# Patient Record
Sex: Female | Born: 1990
Health system: Southern US, Community
[De-identification: ages and names within clinical notes are randomized; demographics above are authoritative.]

## PROBLEM LIST (undated history)

## (undated) DIAGNOSIS — E063 Autoimmune thyroiditis: Secondary | ICD-10-CM

## (undated) DIAGNOSIS — N200 Calculus of kidney: Secondary | ICD-10-CM

## (undated) DIAGNOSIS — T7840XA Allergy, unspecified, initial encounter: Secondary | ICD-10-CM

## (undated) DIAGNOSIS — N289 Disorder of kidney and ureter, unspecified: Secondary | ICD-10-CM

## (undated) HISTORY — PX: DG BARIUM SWALLOW (ARMC HX): HXRAD1448

## (undated) HISTORY — DX: Calculus of kidney: N20.0

## (undated) HISTORY — PX: KIDNEY STONE SURGERY: SHX686

## (undated) HISTORY — DX: Autoimmune thyroiditis: E06.3

## (undated) HISTORY — DX: Allergy, unspecified, initial encounter: T78.40XA

## (undated) HISTORY — PX: WISDOM TOOTH EXTRACTION: SHX21

---
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
## 2010-03-28 ENCOUNTER — Ambulatory Visit: Payer: Self-pay | Admitting: Pediatrics

## 2010-06-23 ENCOUNTER — Ambulatory Visit
Admission: RE | Admit: 2010-06-23 | Discharge: 2010-06-23 | Payer: Self-pay | Source: Home / Self Care | Attending: Pediatrics | Admitting: Pediatrics

## 2010-10-01 ENCOUNTER — Emergency Department (HOSPITAL_COMMUNITY): Payer: BLUE CROSS/BLUE SHIELD

## 2010-10-01 ENCOUNTER — Emergency Department (HOSPITAL_COMMUNITY)
Admission: EM | Admit: 2010-10-01 | Discharge: 2010-10-01 | Disposition: A | Discharge status: Home / Self Care | Payer: BLUE CROSS/BLUE SHIELD | Attending: Emergency Medicine | Admitting: Emergency Medicine

## 2010-10-01 DIAGNOSIS — R339 Retention of urine, unspecified: Secondary | ICD-10-CM | POA: Insufficient documentation

## 2010-10-01 DIAGNOSIS — R109 Unspecified abdominal pain: Secondary | ICD-10-CM | POA: Insufficient documentation

## 2010-10-01 DIAGNOSIS — M549 Dorsalgia, unspecified: Secondary | ICD-10-CM | POA: Insufficient documentation

## 2010-10-01 DIAGNOSIS — R110 Nausea: Secondary | ICD-10-CM | POA: Insufficient documentation

## 2010-10-01 DIAGNOSIS — R42 Dizziness and giddiness: Secondary | ICD-10-CM | POA: Insufficient documentation

## 2010-10-01 LAB — DIFFERENTIAL
Basophils Absolute: 0.1 10*3/uL (ref 0.0–0.1)
Eosinophils Absolute: 0.2 10*3/uL (ref 0.0–0.7)
Eosinophils Relative: 2 % (ref 0–5)
Lymphocytes Relative: 18 % (ref 12–46)
Lymphs Abs: 1.6 10*3/uL (ref 0.7–4.0)
Monocytes Absolute: 0.4 10*3/uL (ref 0.1–1.0)
Monocytes Relative: 4 % (ref 3–12)
Neutro Abs: 6.6 10*3/uL (ref 1.7–7.7)
Neutrophils Relative %: 75 % (ref 43–77)

## 2010-10-01 LAB — CBC
HCT: 42.4 % (ref 36.0–46.0)
Hemoglobin: 14.7 g/dL (ref 12.0–15.0)
MCH: 31.8 pg (ref 26.0–34.0)
MCV: 91.8 fL (ref 78.0–100.0)
Platelets: 321 10*3/uL (ref 150–400)
RBC: 4.62 MIL/uL (ref 3.87–5.11)
WBC: 8.9 10*3/uL (ref 4.0–10.5)

## 2010-10-01 LAB — URINALYSIS, ROUTINE W REFLEX MICROSCOPIC
Glucose, UA: NEGATIVE mg/dL
Ketones, ur: NEGATIVE mg/dL
Nitrite: NEGATIVE
Protein, ur: NEGATIVE mg/dL
Specific Gravity, Urine: 1.02 (ref 1.005–1.030)
Urobilinogen, UA: 0.2 mg/dL (ref 0.0–1.0)
pH: 7 (ref 5.0–8.0)

## 2010-10-01 LAB — BASIC METABOLIC PANEL
BUN: 13 mg/dL (ref 6–23)
CO2: 27 mEq/L (ref 19–32)
Calcium: 9.4 mg/dL (ref 8.4–10.5)
Chloride: 104 mEq/L (ref 96–112)
Creatinine, Ser: 0.77 mg/dL (ref 0.4–1.2)
GFR calc Af Amer: >60 mL/min (ref >60)
Glucose, Bld: 106 mg/dL — ABNORMAL HIGH (ref 70–99)
Potassium: 3.8 mEq/L (ref 3.5–5.1)

## 2010-10-01 LAB — URINE MICROSCOPIC-ADD ON

## 2010-10-01 LAB — POCT PREGNANCY, URINE: Preg Test, Ur: NEGATIVE

## 2010-10-03 LAB — URINE CULTURE
Colony Count: NO GROWTH
Culture  Setup Time: 201204152003
Culture: NO GROWTH

## 2010-10-11 ENCOUNTER — Institutional Professional Consult (permissible substitution) (INDEPENDENT_AMBULATORY_CARE_PROVIDER_SITE_OTHER): Payer: BLUE CROSS/BLUE SHIELD | Admitting: Pediatrics

## 2010-10-11 DIAGNOSIS — R279 Unspecified lack of coordination: Secondary | ICD-10-CM

## 2010-10-11 DIAGNOSIS — R6250 Unspecified lack of expected normal physiological development in childhood: Secondary | ICD-10-CM

## 2010-10-11 DIAGNOSIS — F909 Attention-deficit hyperactivity disorder, unspecified type: Secondary | ICD-10-CM

## 2011-01-17 ENCOUNTER — Institutional Professional Consult (permissible substitution) (INDEPENDENT_AMBULATORY_CARE_PROVIDER_SITE_OTHER): Payer: BLUE CROSS/BLUE SHIELD | Admitting: Pediatrics

## 2011-01-17 DIAGNOSIS — R279 Unspecified lack of coordination: Secondary | ICD-10-CM

## 2011-01-17 DIAGNOSIS — R6250 Unspecified lack of expected normal physiological development in childhood: Secondary | ICD-10-CM

## 2011-01-17 DIAGNOSIS — F909 Attention-deficit hyperactivity disorder, unspecified type: Secondary | ICD-10-CM

## 2011-02-23 ENCOUNTER — Other Ambulatory Visit (HOSPITAL_COMMUNITY): Payer: Self-pay | Admitting: "Endocrinology

## 2011-02-23 DIAGNOSIS — E240 Pituitary-dependent Cushing's disease: Secondary | ICD-10-CM

## 2011-02-26 ENCOUNTER — Ambulatory Visit (HOSPITAL_COMMUNITY)
Admission: RE | Admit: 2011-02-26 | Discharge: 2011-02-26 | Disposition: A | Discharge status: Home / Self Care | Payer: BLUE CROSS/BLUE SHIELD | Source: Ambulatory Visit | Attending: "Endocrinology | Admitting: "Endocrinology

## 2011-02-26 DIAGNOSIS — E249 Cushing's syndrome, unspecified: Secondary | ICD-10-CM | POA: Insufficient documentation

## 2011-02-26 DIAGNOSIS — H538 Other visual disturbances: Secondary | ICD-10-CM | POA: Insufficient documentation

## 2011-02-26 DIAGNOSIS — E240 Pituitary-dependent Cushing's disease: Secondary | ICD-10-CM

## 2011-02-26 DIAGNOSIS — R635 Abnormal weight gain: Secondary | ICD-10-CM | POA: Insufficient documentation

## 2011-02-26 DIAGNOSIS — R51 Headache: Secondary | ICD-10-CM | POA: Insufficient documentation

## 2011-02-26 MED ORDER — GADOBENATE DIMEGLUMINE 529 MG/ML IV SOLN
6.0000 mL | Freq: Once | INTRAVENOUS | Status: AC | PRN
  Administered 2011-02-26: 6 mL via INTRAVENOUS

## 2011-04-19 ENCOUNTER — Institutional Professional Consult (permissible substitution): Payer: BLUE CROSS/BLUE SHIELD | Admitting: Pediatrics

## 2011-05-03 ENCOUNTER — Institutional Professional Consult (permissible substitution) (INDEPENDENT_AMBULATORY_CARE_PROVIDER_SITE_OTHER): Payer: BLUE CROSS/BLUE SHIELD | Admitting: Pediatrics

## 2011-05-03 DIAGNOSIS — F909 Attention-deficit hyperactivity disorder, unspecified type: Secondary | ICD-10-CM

## 2011-05-03 DIAGNOSIS — R279 Unspecified lack of coordination: Secondary | ICD-10-CM

## 2011-05-24 ENCOUNTER — Emergency Department (HOSPITAL_COMMUNITY): Payer: BC Managed Care – PPO

## 2011-05-24 ENCOUNTER — Emergency Department (HOSPITAL_COMMUNITY)
Admission: EM | Admit: 2011-05-24 | Discharge: 2011-05-24 | Disposition: A | Discharge status: Home / Self Care | Payer: BC Managed Care – PPO | Attending: Emergency Medicine | Admitting: Emergency Medicine

## 2011-05-24 DIAGNOSIS — R109 Unspecified abdominal pain: Secondary | ICD-10-CM | POA: Insufficient documentation

## 2011-05-24 DIAGNOSIS — N201 Calculus of ureter: Secondary | ICD-10-CM | POA: Insufficient documentation

## 2011-05-24 DIAGNOSIS — N200 Calculus of kidney: Secondary | ICD-10-CM

## 2011-05-24 HISTORY — DX: Disorder of kidney and ureter, unspecified: N28.9

## 2011-05-24 LAB — URINE MICROSCOPIC-ADD ON

## 2011-05-24 LAB — COMPREHENSIVE METABOLIC PANEL
ALT: 17 U/L (ref 0–35)
Albumin: 4.1 g/dL (ref 3.5–5.2)
Alkaline Phosphatase: 67 U/L (ref 39–117)
Chloride: 99 mEq/L (ref 96–112)
Creatinine, Ser: 0.78 mg/dL (ref 0.50–1.10)
GFR calc Af Amer: >90 mL/min (ref >90)
Glucose, Bld: 101 mg/dL — ABNORMAL HIGH (ref 70–99)
Potassium: 3.6 mEq/L (ref 3.5–5.1)
Sodium: 136 mEq/L (ref 135–145)
Total Bilirubin: 0.5 mg/dL (ref 0.3–1.2)
Total Protein: 8.1 g/dL (ref 6.0–8.3)

## 2011-05-24 LAB — DIFFERENTIAL
Basophils Relative: 0 % (ref 0–1)
Eosinophils Absolute: 0.1 10*3/uL (ref 0.0–0.7)
Eosinophils Relative: 0 % (ref 0–5)
Lymphocytes Relative: 9 % — ABNORMAL LOW (ref 12–46)
Lymphs Abs: 1.1 10*3/uL (ref 0.7–4.0)
Monocytes Absolute: 0.6 10*3/uL (ref 0.1–1.0)
Monocytes Relative: 4 % (ref 3–12)
Neutro Abs: 11.4 10*3/uL — ABNORMAL HIGH (ref 1.7–7.7)
Neutrophils Relative %: 87 % — ABNORMAL HIGH (ref 43–77)

## 2011-05-24 LAB — CBC
HCT: 42.8 % (ref 36.0–46.0)
Hemoglobin: 15.4 g/dL — ABNORMAL HIGH (ref 12.0–15.0)
MCH: 32.1 pg (ref 26.0–34.0)
MCHC: 36 g/dL (ref 30.0–36.0)
Platelets: 318 10*3/uL (ref 150–400)
RBC: 4.8 MIL/uL (ref 3.87–5.11)
RDW: 11.5 % (ref 11.5–15.5)
WBC: 13.2 10*3/uL — ABNORMAL HIGH (ref 4.0–10.5)

## 2011-05-24 LAB — URINALYSIS, ROUTINE W REFLEX MICROSCOPIC
Bilirubin Urine: NEGATIVE
Ketones, ur: 15 mg/dL — AB
Nitrite: NEGATIVE
Protein, ur: NEGATIVE mg/dL
Specific Gravity, Urine: 1.024 (ref 1.005–1.030)
pH: 5.5 (ref 5.0–8.0)

## 2011-05-24 LAB — POCT PREGNANCY, URINE: Preg Test, Ur: NEGATIVE

## 2011-05-24 MED ORDER — SODIUM CHLORIDE 0.9 % IV BOLUS (SEPSIS)
1000.0000 mL | Freq: Once | INTRAVENOUS | Status: AC
  Administered 2011-05-24: 1000 mL via INTRAVENOUS

## 2011-05-24 MED ORDER — MORPHINE SULFATE 4 MG/ML IJ SOLN: 4.0000 mg | Freq: Once | INTRAMUSCULAR | Status: AC

## 2011-05-24 MED ORDER — HYDROMORPHONE HCL PF 2 MG/ML IJ SOLN
INTRAMUSCULAR | Status: AC
  Filled 2011-05-24: qty 1

## 2011-05-24 MED ORDER — ONDANSETRON HCL 4 MG/2ML IJ SOLN: 4.0000 mg | Freq: Once | INTRAMUSCULAR | Status: DC

## 2011-05-24 MED ORDER — MORPHINE SULFATE 2 MG/ML IJ SOLN
INTRAMUSCULAR | Status: AC
  Administered 2011-05-24: 4 mg via INTRAVENOUS
  Filled 2011-05-24: qty 2

## 2011-05-24 MED ORDER — KETOROLAC TROMETHAMINE 30 MG/ML IJ SOLN
30.0000 mg | Freq: Once | INTRAMUSCULAR | Status: AC
  Administered 2011-05-24: 30 mg via INTRAVENOUS
  Filled 2011-05-24: qty 1

## 2011-05-24 MED ORDER — HYDROMORPHONE HCL PF 1 MG/ML IJ SOLN
1.0000 mg | Freq: Once | INTRAMUSCULAR | Status: AC
  Administered 2011-05-24: 1 mg via INTRAVENOUS

## 2011-05-24 MED ORDER — ONDANSETRON HCL 4 MG/2ML IJ SOLN
4.0000 mg | Freq: Once | INTRAMUSCULAR | Status: AC
  Administered 2011-05-24: 4 mg via INTRAVENOUS
  Filled 2011-05-24: qty 2

## 2011-05-24 MED ORDER — OXYCODONE-ACETAMINOPHEN 5-325 MG PO TABS: 2.0000 | ORAL_TABLET | ORAL | Status: AC | PRN

## 2011-05-24 NOTE — ED Notes (Signed)
C/o low abdominal pain/tenderness radiating into right flank area---Reports numerous kidney stones and onset of this episode was about one week ago---Unable to void except for very small amts. At a time---pt. Is thin and abdomen does not appear distended--rates her pain a 7 and states she feels like her bladder is full.

## 2011-05-24 NOTE — ED Notes (Signed)
Attempted to collect urine specimen but pt states she cannot go at this time

## 2011-05-24 NOTE — ED Notes (Signed)
MD at bedside. Assessment done.

## 2011-05-24 NOTE — ED Notes (Signed)
Pt okay. Pain medication given, bladder scanned. 75 cc scanned. Pt voided 75 cc. Family at bedside

## 2011-05-24 NOTE — ED Provider Notes (Addendum)
History     CSN: [id number] Arrival date & time: 05/24/2011  5:26 PM   First MD Initiated Contact with Patient 05/24/11 1914      Chief Complaint  Patient presents with  . Flank Pain  . Nephrolithiasis   20 year old female with a known history of kidney stones. Reports decreased urinary output, as well as diffuse right flank pain, worsening over the past 2 days. She has had some nausea, but no vomiting. Denies any vaginal bleeding or discharge. She states it felt like her previous kidney stones. She did take one hydrocodone prior to arrival (Consider location/radiation/quality/duration/timing/severity/associated sxs/prior treatment) HPI  Past Medical History  Diagnosis Date  . Renal disorder     Past Surgical History  Procedure Date  . Wisdom tooth extraction     No family history on file.  History  Substance Use Topics  . Smoking status: Never Smoker   . Smokeless tobacco: Not on file  . Alcohol Use: No    OB History    Grav Para Term Preterm Abortions TAB SAB Ect Mult Living                  Review of Systems  All other systems reviewed and are negative.    Allergies  Review of patient's allergies indicates no known allergies.  Home Medications   Current Outpatient Rx  Name Route Sig Dispense Refill  . FLUCONAZOLE 150 MG PO TABS Oral Take 150 mg by mouth once. Take one tablet on 05/23/11 and take one tablet 3 days later     . HYDROCODONE-ACETAMINOPHEN 10-325 MG PO TABS Oral Take 1 tablet by mouth every 4 (four) hours as needed.      . METHYLPHENIDATE HCL ER 18 MG PO TBCR Oral Take 36 mg by mouth every morning. Takes two tablet every morning     . NUVARING VA Vaginal Place 1 application vaginally.        BP 109/73  Pulse 67  Temp(Src) 97.6 F (36.4 C) (Oral)  Resp 18  SpO2 98%  LMP 05/20/2011  Physical Exam  Nursing note and vitals reviewed. Constitutional: She is oriented to person, place, and time. She appears well-developed and well-nourished.    HENT:  Head: Normocephalic and atraumatic.  Eyes: Conjunctivae and EOM are normal. Pupils are equal, round, and reactive to light.  Neck: Neck supple.  Cardiovascular: Normal rate and regular rhythm.  Exam reveals no gallop and no friction rub.   No murmur heard. Pulmonary/Chest: Breath sounds normal. She has no wheezes. She has no rales. She exhibits no tenderness.  Abdominal: Soft. Bowel sounds are normal. She exhibits no distension. There is no tenderness. There is no rebound and no guarding.  Genitourinary:       Right-sided CVA tenderness. No swelling or ecchymoses.  Musculoskeletal: Normal range of motion.  Neurological: She is alert and oriented to person, place, and time. No cranial nerve deficit. Coordination normal.  Skin: Skin is warm and dry. No rash noted.  Psychiatric: She has a normal mood and affect.    ED Course  Procedures (including critical care time)  Labs Reviewed - No data to display No results found.   No diagnosis found.    MDM  Pt is seen and examined;  Initial history and physical completed.  Will follow.   Results for orders placed during the hospital encounter of 05/24/11  CBC      Component Value Range   WBC 13.2 (*) 4.0 - 10.5 (K/uL)  RBC 4.80  3.87 - 5.11 (MIL/uL)   Hemoglobin 15.4 (*) 12.0 - 15.0 (g/dL)   HCT 11.9  14.7 - 82.9 (%)   MCV 89.2  78.0 - 100.0 (fL)   MCH 32.1  26.0 - 34.0 (pg)   MCHC 36.0  30.0 - 36.0 (g/dL)   RDW 56.2  13.0 - 86.5 (%)   Platelets 318  150 - 400 (K/uL)  DIFFERENTIAL      Component Value Range   Neutrophils Relative 87 (*) 43 - 77 (%)   Neutro Abs 11.4 (*) 1.7 - 7.7 (K/uL)   Lymphocytes Relative 9 (*) 12 - 46 (%)   Lymphs Abs 1.1  0.7 - 4.0 (K/uL)   Monocytes Relative 4  3 - 12 (%)   Monocytes Absolute 0.6  0.1 - 1.0 (K/uL)   Eosinophils Relative 0  0 - 5 (%)   Eosinophils Absolute 0.1  0.0 - 0.7 (K/uL)   Basophils Relative 0  0 - 1 (%)   Basophils Absolute 0.0  0.0 - 0.1 (K/uL)   No results  found.         Malonie Tatum A. Patrica Duel, MD 05/24/11 2007  9:46 PM  Feeling much better.  Bladder scan showed 71cc urine.  Stable for outpt f/u.   Zelma Mazariego A. Patrica Duel, MD 05/24/11 2147

## 2011-05-24 NOTE — ED Notes (Signed)
Pt C/O flank pain, right side.  Hx of kidney stones.  C/O decreased urinary output.  C/O of nausea/dizziness.  Denies vomiting.

## 2011-05-24 NOTE — ED Notes (Signed)
Pt ready for discharge. Stable. Ambulatory. Parents at bedside

## 2011-05-24 NOTE — ED Notes (Signed)
MD at bedside. 

## 2011-06-19 HISTORY — PX: WISDOM TOOTH EXTRACTION: SHX21

## 2011-07-31 ENCOUNTER — Institutional Professional Consult (permissible substitution) (INDEPENDENT_AMBULATORY_CARE_PROVIDER_SITE_OTHER): Payer: BC Managed Care – PPO | Admitting: Pediatrics

## 2011-07-31 DIAGNOSIS — R279 Unspecified lack of coordination: Secondary | ICD-10-CM

## 2011-07-31 DIAGNOSIS — F909 Attention-deficit hyperactivity disorder, unspecified type: Secondary | ICD-10-CM

## 2011-08-06 ENCOUNTER — Emergency Department (HOSPITAL_COMMUNITY)
Admission: EM | Admit: 2011-08-06 | Discharge: 2011-08-06 | Disposition: A | Discharge status: Home / Self Care | Payer: BC Managed Care – PPO | Attending: Emergency Medicine | Admitting: Emergency Medicine

## 2011-08-06 ENCOUNTER — Encounter (HOSPITAL_COMMUNITY): Payer: Self-pay | Admitting: Emergency Medicine

## 2011-08-06 DIAGNOSIS — N200 Calculus of kidney: Secondary | ICD-10-CM | POA: Insufficient documentation

## 2011-08-06 LAB — URINALYSIS, ROUTINE W REFLEX MICROSCOPIC
Bilirubin Urine: NEGATIVE
Glucose, UA: NEGATIVE mg/dL
Ketones, ur: 15 mg/dL — AB
Leukocytes, UA: NEGATIVE
Protein, ur: NEGATIVE mg/dL
pH: 6 (ref 5.0–8.0)

## 2011-08-06 LAB — BASIC METABOLIC PANEL
BUN: 12 mg/dL (ref 6–23)
CO2: 26 mEq/L (ref 19–32)
Calcium: 10 mg/dL (ref 8.4–10.5)
Chloride: 98 mEq/L (ref 96–112)
Creatinine, Ser: 0.87 mg/dL (ref 0.50–1.10)

## 2011-08-06 LAB — URINE MICROSCOPIC-ADD ON

## 2011-08-06 MED ORDER — HYDROMORPHONE HCL PF 1 MG/ML IJ SOLN
1.0000 mg | Freq: Once | INTRAMUSCULAR | Status: AC
  Administered 2011-08-06: 1 mg via INTRAVENOUS
  Filled 2011-08-06: qty 1

## 2011-08-06 MED ORDER — HYDROMORPHONE HCL PF 1 MG/ML IJ SOLN
1.0000 mg | Freq: Once | INTRAMUSCULAR | Status: DC
  Filled 2011-08-06: qty 1

## 2011-08-06 MED ORDER — KETOROLAC TROMETHAMINE 30 MG/ML IJ SOLN
30.0000 mg | Freq: Once | INTRAMUSCULAR | Status: AC
  Administered 2011-08-06: 30 mg via INTRAVENOUS
  Filled 2011-08-06: qty 1

## 2011-08-06 MED ORDER — ONDANSETRON HCL 4 MG/2ML IJ SOLN
4.0000 mg | Freq: Once | INTRAMUSCULAR | Status: AC
  Administered 2011-08-06: 4 mg via INTRAVENOUS
  Filled 2011-08-06: qty 2

## 2011-08-06 MED ORDER — SODIUM CHLORIDE 0.9 % IV BOLUS (SEPSIS)
1000.0000 mL | Freq: Once | INTRAVENOUS | Status: AC
  Administered 2011-08-06: 1000 mL via INTRAVENOUS

## 2011-08-06 MED ORDER — HYDROMORPHONE HCL PF 1 MG/ML IJ SOLN
1.0000 mg | Freq: Once | INTRAMUSCULAR | Status: AC
  Administered 2011-08-06: 1 mg via INTRAVENOUS

## 2011-08-06 MED ORDER — PROMETHAZINE HCL 25 MG RE SUPP: 25.0000 mg | Freq: Four times a day (QID) | RECTAL | Status: AC | PRN

## 2011-08-06 MED ORDER — OXYCODONE-ACETAMINOPHEN 5-325 MG PO TABS: 1.0000 | ORAL_TABLET | ORAL | Status: AC | PRN

## 2011-08-06 NOTE — ED Notes (Signed)
Pt alert & oriented x4, stable gait. Pt given discharge instructions, paperwork & prescription(s), pt verbalized understanding. Pt left department w/ no further questions.  

## 2011-08-06 NOTE — Discharge Instructions (Signed)
Kidney Stones Kidney stones (ureteral lithiasis) are deposits that form inside your kidneys. The intense pain is caused by the stone moving through the urinary tract. When the stone moves, the ureter goes into spasm around the stone. The stone is usually passed in the urine.  CAUSES   A disorder that makes certain neck glands produce too much parathyroid hormone (primary hyperparathyroidism).   A buildup of uric acid crystals.   Narrowing (stricture) of the ureter.   A kidney obstruction present at birth (congenital obstruction).   Previous surgery on the kidney or ureters.   Numerous kidney infections.  SYMPTOMS   Feeling sick to your stomach (nauseous).   Throwing up (vomiting).   Blood in the urine (hematuria).   Pain that usually spreads (radiates) to the groin.   Frequency or urgency of urination.  DIAGNOSIS   Taking a history and physical exam.   Blood or urine tests.   Computerized X-ray scan (CT scan).   Occasionally, an examination of the inside of the urinary bladder (cystoscopy) is performed.  TREATMENT   Observation.   Increasing your fluid intake.   Surgery may be needed if you have severe pain or persistent obstruction.  The size, location, and chemical composition are all important variables that will determine the proper choice of action for you. Talk to your caregiver to better understand your situation so that you will minimize the risk of injury to yourself and your kidney.  HOME CARE INSTRUCTIONS   Drink enough water and fluids to keep your urine clear or pale yellow.   Strain all urine through the provided strainer. Keep all particulate matter and stones for your caregiver to see. The stone causing the pain may be as small as a grain of salt. It is very important to use the strainer each and every time you pass your urine. The collection of your stone will allow your caregiver to analyze it and verify that a stone has actually passed.   Only take  over-the-counter or prescription medicines for pain, discomfort, or fever as directed by your caregiver.   Make a follow-up appointment with your caregiver as directed.   Get follow-up X-rays if required. The absence of pain does not always mean that the stone has passed. It may have only stopped moving. If the urine remains completely obstructed, it can cause loss of kidney function or even complete destruction of the kidney. It is your responsibility to make sure X-rays and follow-ups are completed. Ultrasounds of the kidney can show blockages and the status of the kidney. Ultrasounds are not associated with any radiation and can be performed easily in a matter of minutes.  SEEK IMMEDIATE MEDICAL CARE IF:   Pain cannot be controlled with the prescribed medicine.   You have a fever.   The severity or intensity of pain increases over 18 hours and is not relieved by pain medicine.   You develop a new onset of abdominal pain.   You feel faint or pass out.  MAKE SURE YOU:   Understand these instructions.   Will watch your condition.   Will get help right away if you are not doing well or get worse.  Document Released: 06/04/2005 Document Revised: 02/14/2011 Document Reviewed: 09/30/2009 [facility] Patient Information 2012 [place], [place].   As discussed, use the phenergan suppository prescribed if your nausea returns and you are unable to keep your pain medicine down.  Make sure you are drinking plenty of fluids.  Return here or call Dr  Tannenbaum if you develop fevers,  Uncontrolled vomiting or uncontrolled pain.  Your labs are reassuring tonight - your kidney function is healthy and you have no sign of infection in your urine.  Strain your urine and call Dr. Patsi Sears for a recheck this week.

## 2011-08-06 NOTE — ED Notes (Signed)
Family at bedside. Patient states she does not need anything at this time. 

## 2011-08-06 NOTE — ED Notes (Signed)
Pt c/o left flank pain with nausea/vomiting since this am.

## 2011-08-08 NOTE — ED Provider Notes (Signed)
History     CSN: [id number]  Arrival date & time 08/06/11  1423   First MD Initiated Contact with Patient 08/06/11 1613      Chief Complaint  Patient presents with  . Flank Pain  . Nausea  . Emesis    (Consider location/radiation/quality/duration/timing/severity/associated sxs/prior treatment) HPI Comments: Patient presents with known left renal stones as she had a CT scan 4 months ago during which time she successfully passed a right kidney stone.  She had multiple small stones in her left kidney per this previous Ct scan.  Patient is a 21 y.o. female presenting with flank pain and vomiting. The history is provided by the patient and a parent.  Flank Pain This is a recurrent problem. The current episode started today. Episode frequency: waxing and waning left flank pain which is stabbing in quality. The problem has been unchanged. Associated symptoms include nausea, urinary symptoms and vomiting. Pertinent negatives include no abdominal pain, arthralgias, chest pain, chills, congestion, fever, headaches, joint swelling, neck pain, numbness, rash, sore throat or weakness. The symptoms are aggravated by nothing. Treatments tried: Has been unable to keep keep her percocet and her phenergan down due to excessive vomiting,  therefore home medicines have not helped with this pain.  Emesis  Pertinent negatives include no abdominal pain, no arthralgias, no chills, no fever and no headaches.  Flank Pain This is a recurrent problem. The current episode started today. Episode frequency: waxing and waning left flank pain which is stabbing in quality. The problem has been unchanged. Pertinent negatives include no chest pain, no abdominal pain, no headaches and no shortness of breath. The symptoms are aggravated by nothing. Treatments tried: Has been unable to keep keep her percocet and her phenergan down due to excessive vomiting,  therefore home medicines have not helped with this pain.    Past  Medical History  Diagnosis Date  . Renal disorder     Past Surgical History  Procedure Date  . Wisdom tooth extraction     History reviewed. No pertinent family history.  History  Substance Use Topics  . Smoking status: Never Smoker   . Smokeless tobacco: Not on file  . Alcohol Use: No    OB History    Grav Para Term Preterm Abortions TAB SAB Ect Mult Living                  Review of Systems  Constitutional: Negative for fever and chills.  HENT: Negative for congestion, sore throat and neck pain.   Eyes: Negative.   Respiratory: Negative for chest tightness and shortness of breath.   Cardiovascular: Negative for chest pain.  Gastrointestinal: Positive for nausea and vomiting. Negative for abdominal pain.  Genitourinary: Positive for flank pain.       Urinary frequency.  Musculoskeletal: Negative for joint swelling and arthralgias.  Skin: Negative.  Negative for rash and wound.  Neurological: Negative for dizziness, weakness, light-headedness, numbness and headaches.  Hematological: Negative.   Psychiatric/Behavioral: Negative.     Allergies  Daytrana  Home Medications   Current Outpatient Rx  Name Route Sig Dispense Refill  . NUVARING VA Vaginal Place 1 application vaginally.      . METHYLPHENIDATE HCL ER 18 MG PO TBCR Oral Take 36 mg by mouth every morning. Takes two tablet every morning     . OXYCODONE-ACETAMINOPHEN 5-325 MG PO TABS Oral Take 1 tablet by mouth every 4 (four) hours as needed. For pain    . OXYCODONE-ACETAMINOPHEN 5-325  MG PO TABS Oral Take 1 tablet by mouth every 4 (four) hours as needed for pain. 20 tablet 0  . PROMETHAZINE HCL 25 MG RE SUPP Rectal Place 1 suppository (25 mg total) rectally every 6 (six) hours as needed for nausea. 12 each 0    BP 111/53  Pulse 76  Temp(Src) 97.4 F (36.3 C) (Oral)  Resp 16  Ht 5\' 5"  (1.651 m)  Wt 125 lb (56.7 kg)  BMI 20.80 kg/m2  SpO2 100%  LMP 08/05/2011  Physical Exam  Nursing note and vitals  reviewed. Constitutional: She is oriented to person, place, and time. She appears well-developed and well-nourished.  HENT:  Head: Normocephalic and atraumatic.  Eyes: Conjunctivae are normal.  Neck: Normal range of motion.  Cardiovascular: Normal rate, regular rhythm, normal heart sounds and intact distal pulses.   Pulmonary/Chest: Effort normal and breath sounds normal. She has no wheezes.  Abdominal: Soft. Bowel sounds are normal. She exhibits no distension and no mass. There is no rebound and no guarding.       Left cva ttp.  Musculoskeletal: Normal range of motion.  Neurological: She is alert and oriented to person, place, and time.  Skin: Skin is warm and dry.  Psychiatric: She has a normal mood and affect.    ED Course  Procedures (including critical care time)  Labs Reviewed  URINALYSIS, ROUTINE W REFLEX MICROSCOPIC - Abnormal; Notable for the following:    Hgb urine dipstick MODERATE (*)    Ketones, ur 15 (*)    All other components within normal limits  BASIC METABOLIC PANEL - Abnormal; Notable for the following:    Glucose, Bld 122 (*)    All other components within normal limits  POCT PREGNANCY, URINE  URINE MICROSCOPIC-ADD ON  LAB REPORT - SCANNED   No results found.   1. Kidney stones     Prior Ct scan reviewed.  Small multiple stones in left kidney.  Pt given 2 liters NS in ed. Dilaudid 1 mg IV x 2,  zofran 4 mg IV,  toradol 30 mg IV.  Patient pain free at time of dc.  She does have a urine strainer and plans to call her urologist Dr. Patsi Sears in am for recheck.  Prescribed phenergan suppository in event unable to tolerate PO's.  She did drink po fluids in ed without difficulty.  MDM  The patient appears reasonably screened and/or stabilized for discharge and I doubt any other medical condition or other [facility] requiring further screening, evaluation, or treatment in the ED at this time prior to discharge.   Medical screening examination/treatment/procedure(s)  were conducted as a shared visit with non-physician practitioner(s) and myself.  I personally evaluated the patient during the encounter I reviewed pt's lab results with pt and her mother.  She will followup with Dr. Patsi Sears at [facility] in [place], [place]. Osvaldo Human, M.D.       Candis Musa, PA 08/08/11 1451  Carleene Cooper III, MD 08/09/11 1048

## 2011-11-14 ENCOUNTER — Institutional Professional Consult (permissible substitution): Payer: BC Managed Care – PPO | Admitting: Pediatrics

## 2011-12-04 ENCOUNTER — Institutional Professional Consult (permissible substitution) (INDEPENDENT_AMBULATORY_CARE_PROVIDER_SITE_OTHER): Payer: BC Managed Care – PPO | Admitting: Pediatrics

## 2011-12-04 DIAGNOSIS — R279 Unspecified lack of coordination: Secondary | ICD-10-CM

## 2011-12-04 DIAGNOSIS — F909 Attention-deficit hyperactivity disorder, unspecified type: Secondary | ICD-10-CM

## 2012-03-14 ENCOUNTER — Institutional Professional Consult (permissible substitution) (INDEPENDENT_AMBULATORY_CARE_PROVIDER_SITE_OTHER): Payer: BC Managed Care – PPO | Admitting: Pediatrics

## 2012-03-14 DIAGNOSIS — R279 Unspecified lack of coordination: Secondary | ICD-10-CM

## 2012-03-14 DIAGNOSIS — F909 Attention-deficit hyperactivity disorder, unspecified type: Secondary | ICD-10-CM

## 2012-04-01 ENCOUNTER — Other Ambulatory Visit: Payer: Self-pay | Admitting: Adult Health

## 2012-04-01 ENCOUNTER — Other Ambulatory Visit (HOSPITAL_COMMUNITY)
Admission: RE | Admit: 2012-04-01 | Discharge: 2012-04-01 | Disposition: A | Discharge status: Home / Self Care | Payer: BC Managed Care – PPO | Source: Ambulatory Visit | Attending: Obstetrics and Gynecology | Admitting: Obstetrics and Gynecology

## 2012-04-01 DIAGNOSIS — Z01419 Encounter for gynecological examination (general) (routine) without abnormal findings: Secondary | ICD-10-CM | POA: Insufficient documentation

## 2012-06-03 ENCOUNTER — Institutional Professional Consult (permissible substitution) (INDEPENDENT_AMBULATORY_CARE_PROVIDER_SITE_OTHER): Payer: BC Managed Care – PPO | Admitting: Pediatrics

## 2012-06-03 DIAGNOSIS — R279 Unspecified lack of coordination: Secondary | ICD-10-CM

## 2012-06-03 DIAGNOSIS — F909 Attention-deficit hyperactivity disorder, unspecified type: Secondary | ICD-10-CM

## 2012-09-12 ENCOUNTER — Institutional Professional Consult (permissible substitution) (INDEPENDENT_AMBULATORY_CARE_PROVIDER_SITE_OTHER): Payer: BC Managed Care – PPO | Admitting: Pediatrics

## 2012-09-12 DIAGNOSIS — F909 Attention-deficit hyperactivity disorder, unspecified type: Secondary | ICD-10-CM

## 2012-09-12 DIAGNOSIS — R279 Unspecified lack of coordination: Secondary | ICD-10-CM

## 2012-11-03 IMAGING — CT CT ABD-PELV W/O CM
2 of 3 series · 15 of 42 positions shown, 19 images · non-contrast
Comparison: 10/01/2010

CLINICAL DATA: Right flank pain.

CT ABDOMEN AND PELVIS WITHOUT CONTRAST
TECHNIQUE: Multidetector CT imaging of the abdomen and pelvis was
performed following the standard protocol without intravenous
contrast.

[Series 2: abd/pel w/o · axial · non-contrast · 0.68mm/px · z∈[-496,-136]mm · 12 of 83 slices shown, 16 images]
[im 7/83  soft-tissue]
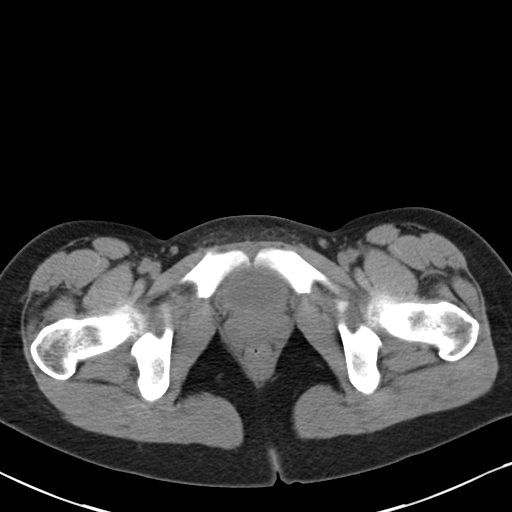
[im 7/83  bone]
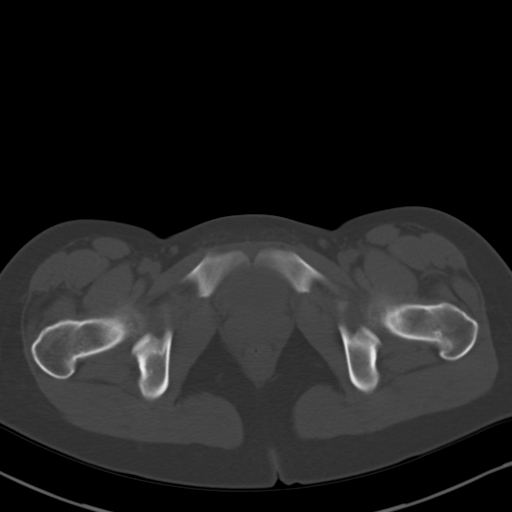
[im 14/83  soft-tissue]
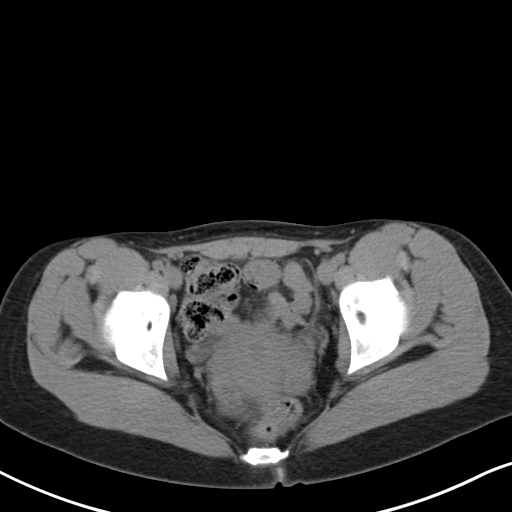
[im 21/83  soft-tissue]
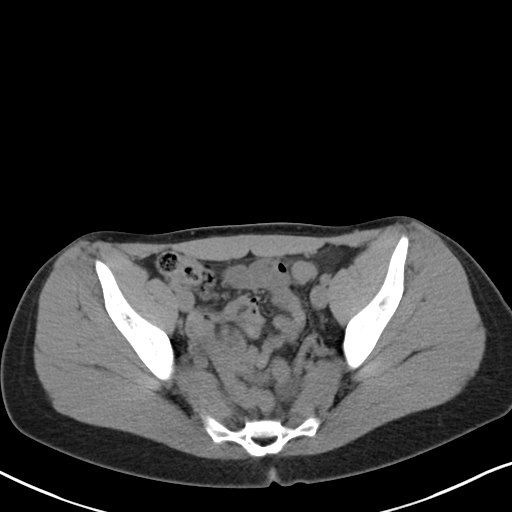
[im 31/83  soft-tissue]
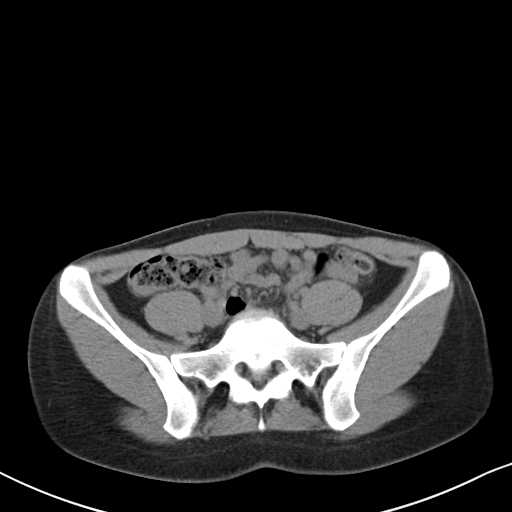
[im 38/83  soft-tissue]
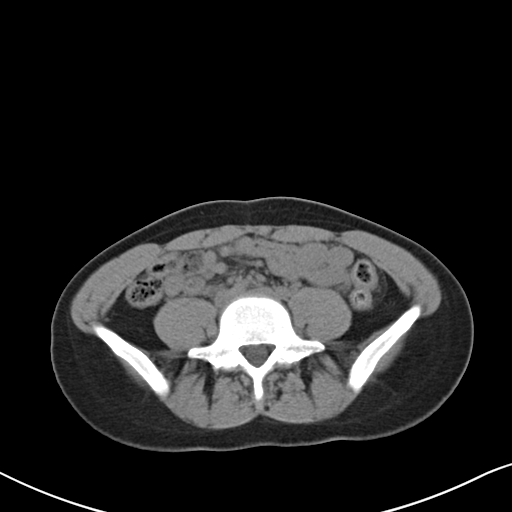
[im 45/83  soft-tissue]
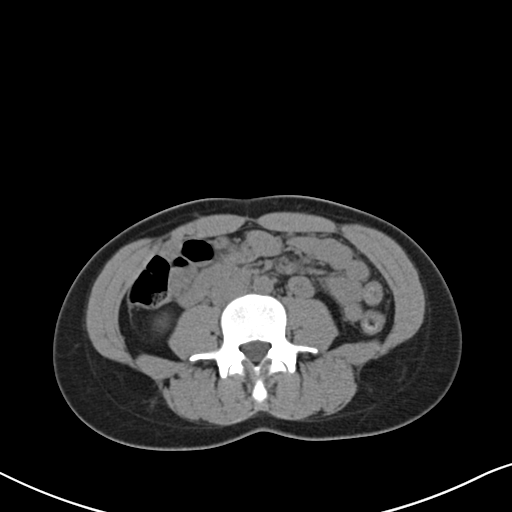
[im 52/83  soft-tissue]
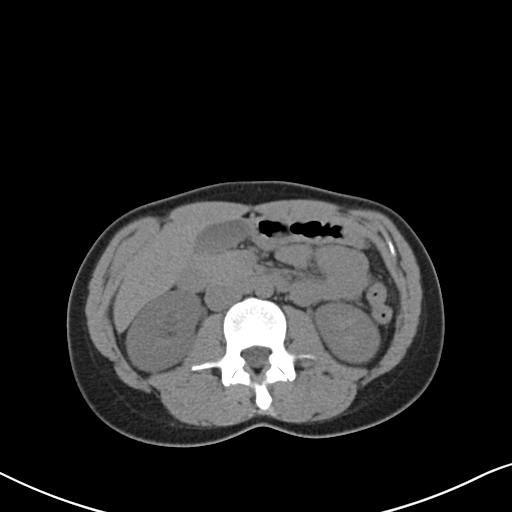
[im 62/83  soft-tissue]
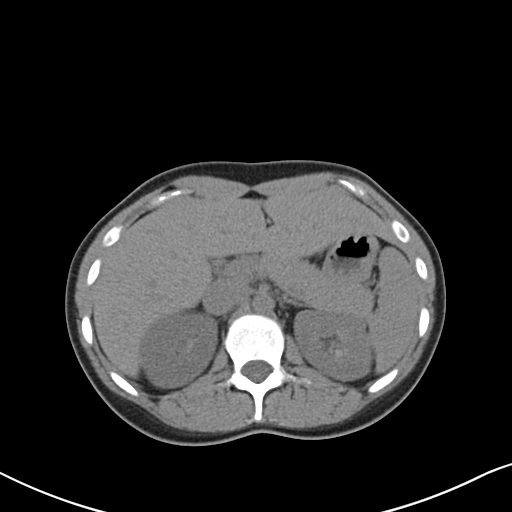
[im 69/83  soft-tissue]
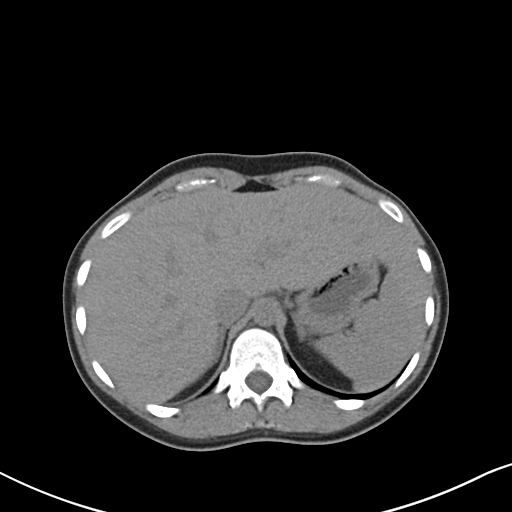
[im 69/83  lung]
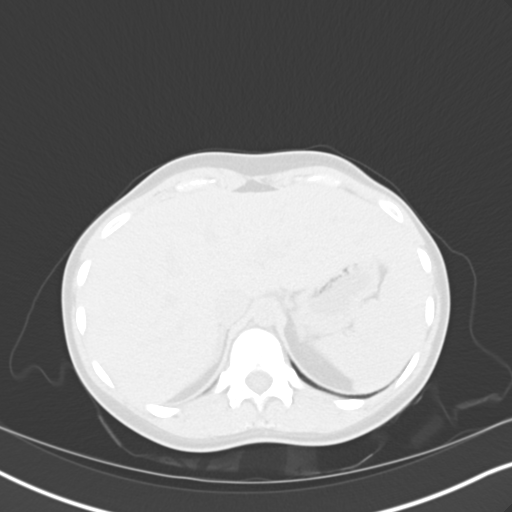
[im 69/83  bone]
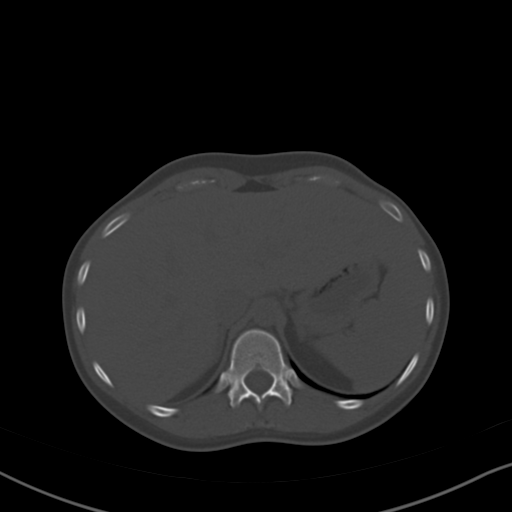
[im 72/83  lung]
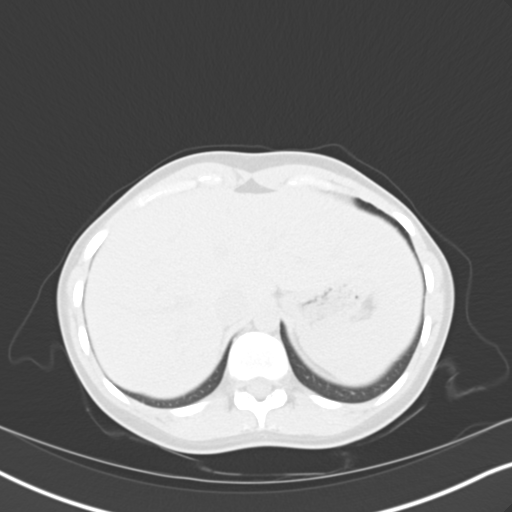
[im 76/83  soft-tissue]
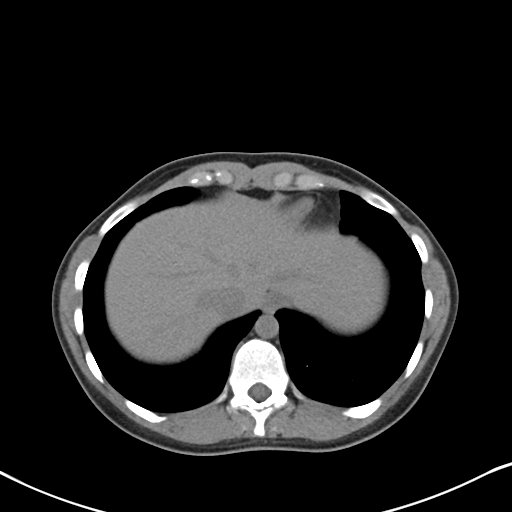
[im 76/83  lung]
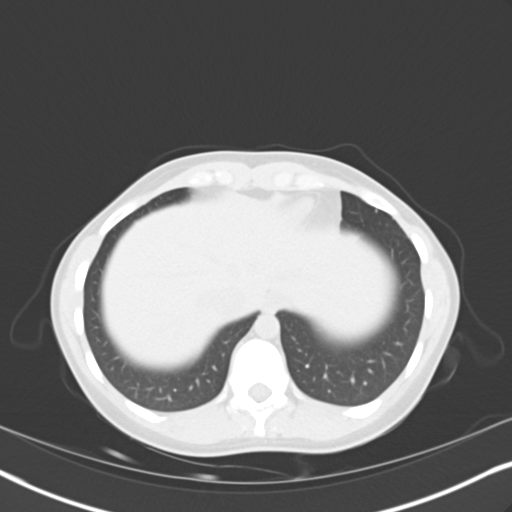
[im 79/83  lung]
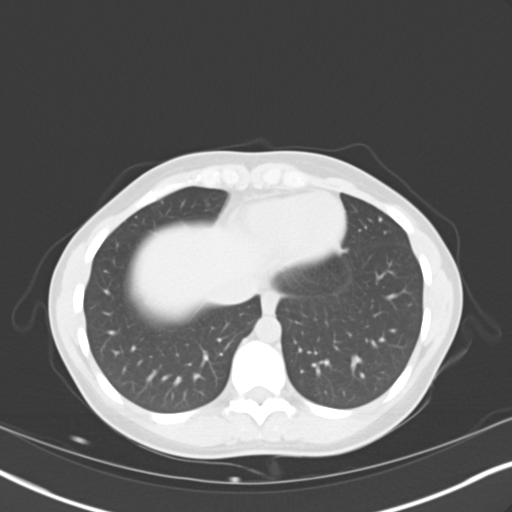

[Series 5: coronal · coronal · 0.81mm/px · 3 of 39 slices shown]
[im 13/39  soft-tissue]
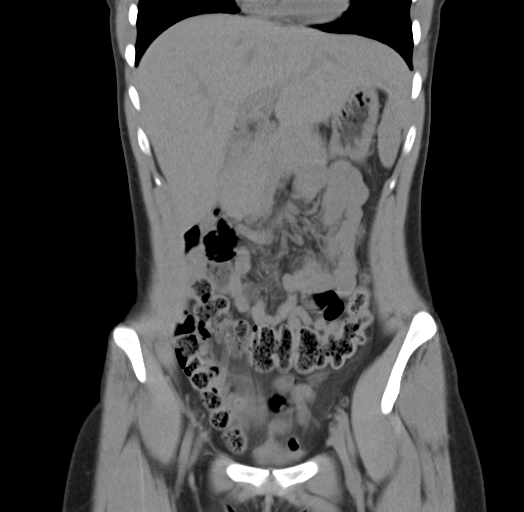
[im 17/39  soft-tissue]
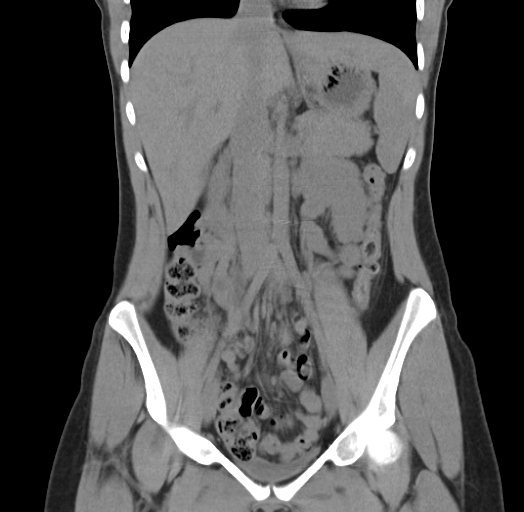
[im 22/39  soft-tissue]
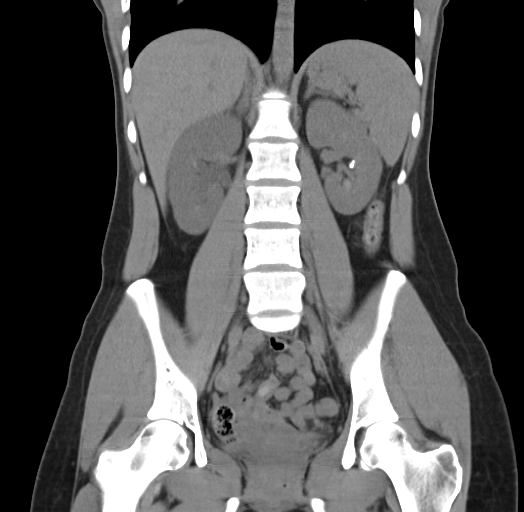

[15 of 42 positions shown; findings below may reference images not displayed]

FINDINGS: Lung bases are clear.  No effusions.  Heart is normal
size.

Liver, gallbladder, spleen, pancreas and adrenals have an
unremarkable unenhanced appearance.

There is mild right hydronephrosis and hydroureter due to a 4 mm
right UVJ stone.  No hydronephrosis on the left.  Small left
nonobstructing renal stones.

Bowel grossly unremarkable.  No free fluid, free air, or
adenopathy.  Appendix is not definitively seen.  Uterus and adnexa
unremarkable.  Trace free fluid in the pelvis.  No acute bony
abnormality.
IMPRESSION: 4 mm right UVJ stone with mild right hydronephrosis.

Left nephrolithiasis.

## 2012-12-12 ENCOUNTER — Institutional Professional Consult (permissible substitution) (INDEPENDENT_AMBULATORY_CARE_PROVIDER_SITE_OTHER): Payer: BC Managed Care – PPO | Admitting: Pediatrics

## 2012-12-12 DIAGNOSIS — F909 Attention-deficit hyperactivity disorder, unspecified type: Secondary | ICD-10-CM

## 2012-12-12 DIAGNOSIS — R279 Unspecified lack of coordination: Secondary | ICD-10-CM

## 2013-02-20 ENCOUNTER — Institutional Professional Consult (permissible substitution) (INDEPENDENT_AMBULATORY_CARE_PROVIDER_SITE_OTHER): Payer: BC Managed Care – PPO | Admitting: Pediatrics

## 2013-02-20 DIAGNOSIS — R279 Unspecified lack of coordination: Secondary | ICD-10-CM

## 2013-02-20 DIAGNOSIS — F909 Attention-deficit hyperactivity disorder, unspecified type: Secondary | ICD-10-CM

## 2013-03-20 ENCOUNTER — Institutional Professional Consult (permissible substitution): Payer: BC Managed Care – PPO | Admitting: Pediatrics

## 2013-05-06 ENCOUNTER — Other Ambulatory Visit: Payer: Self-pay | Admitting: Adult Health

## 2013-05-22 ENCOUNTER — Institutional Professional Consult (permissible substitution): Payer: BC Managed Care – PPO | Admitting: Pediatrics

## 2013-09-18 ENCOUNTER — Institutional Professional Consult (permissible substitution): Payer: 59 | Admitting: Pediatrics

## 2013-09-18 DIAGNOSIS — R279 Unspecified lack of coordination: Secondary | ICD-10-CM

## 2013-09-18 DIAGNOSIS — F909 Attention-deficit hyperactivity disorder, unspecified type: Secondary | ICD-10-CM

## 2013-12-29 ENCOUNTER — Institutional Professional Consult (permissible substitution) (INDEPENDENT_AMBULATORY_CARE_PROVIDER_SITE_OTHER): Payer: 59 | Admitting: Pediatrics

## 2013-12-29 DIAGNOSIS — F909 Attention-deficit hyperactivity disorder, unspecified type: Secondary | ICD-10-CM

## 2013-12-29 DIAGNOSIS — R279 Unspecified lack of coordination: Secondary | ICD-10-CM

## 2015-06-19 HISTORY — PX: KIDNEY STONE SURGERY: SHX686

## 2016-07-06 ENCOUNTER — Encounter: Payer: Self-pay | Admitting: Pediatrics

## 2018-03-27 DIAGNOSIS — Z23 Encounter for immunization: Secondary | ICD-10-CM | POA: Diagnosis not present

## 2018-07-01 DIAGNOSIS — F411 Generalized anxiety disorder: Secondary | ICD-10-CM | POA: Diagnosis not present

## 2018-07-17 DIAGNOSIS — F411 Generalized anxiety disorder: Secondary | ICD-10-CM | POA: Diagnosis not present

## 2018-07-31 DIAGNOSIS — F411 Generalized anxiety disorder: Secondary | ICD-10-CM | POA: Diagnosis not present

## 2018-08-14 DIAGNOSIS — F411 Generalized anxiety disorder: Secondary | ICD-10-CM | POA: Diagnosis not present

## 2018-08-28 DIAGNOSIS — F411 Generalized anxiety disorder: Secondary | ICD-10-CM | POA: Diagnosis not present

## 2018-09-11 DIAGNOSIS — F411 Generalized anxiety disorder: Secondary | ICD-10-CM | POA: Diagnosis not present

## 2018-09-16 DIAGNOSIS — F411 Generalized anxiety disorder: Secondary | ICD-10-CM | POA: Diagnosis not present

## 2018-09-25 DIAGNOSIS — F411 Generalized anxiety disorder: Secondary | ICD-10-CM | POA: Diagnosis not present

## 2018-10-09 DIAGNOSIS — F411 Generalized anxiety disorder: Secondary | ICD-10-CM | POA: Diagnosis not present

## 2018-10-23 DIAGNOSIS — F411 Generalized anxiety disorder: Secondary | ICD-10-CM | POA: Diagnosis not present

## 2018-10-23 DIAGNOSIS — Z139 Encounter for screening, unspecified: Secondary | ICD-10-CM | POA: Diagnosis not present

## 2018-10-23 DIAGNOSIS — R87612 Low grade squamous intraepithelial lesion on cytologic smear of cervix (LGSIL): Secondary | ICD-10-CM | POA: Diagnosis not present

## 2018-10-23 DIAGNOSIS — Z124 Encounter for screening for malignant neoplasm of cervix: Secondary | ICD-10-CM | POA: Diagnosis not present

## 2018-10-23 DIAGNOSIS — Z01419 Encounter for gynecological examination (general) (routine) without abnormal findings: Secondary | ICD-10-CM | POA: Diagnosis not present

## 2018-10-23 DIAGNOSIS — Z3040 Encounter for surveillance of contraceptives, unspecified: Secondary | ICD-10-CM | POA: Diagnosis not present

## 2018-10-23 DIAGNOSIS — Z87442 Personal history of urinary calculi: Secondary | ICD-10-CM | POA: Insufficient documentation

## 2018-11-03 ENCOUNTER — Other Ambulatory Visit: Payer: Self-pay | Admitting: Obstetrics and Gynecology

## 2018-11-03 DIAGNOSIS — B977 Papillomavirus as the cause of diseases classified elsewhere: Secondary | ICD-10-CM | POA: Diagnosis not present

## 2018-11-03 DIAGNOSIS — N870 Mild cervical dysplasia: Secondary | ICD-10-CM | POA: Diagnosis not present

## 2018-11-03 DIAGNOSIS — R87612 Low grade squamous intraepithelial lesion on cytologic smear of cervix (LGSIL): Secondary | ICD-10-CM | POA: Diagnosis not present

## 2018-11-04 DIAGNOSIS — F411 Generalized anxiety disorder: Secondary | ICD-10-CM | POA: Diagnosis not present

## 2018-11-17 DIAGNOSIS — Z08 Encounter for follow-up examination after completed treatment for malignant neoplasm: Secondary | ICD-10-CM | POA: Diagnosis not present

## 2018-11-17 DIAGNOSIS — N870 Mild cervical dysplasia: Secondary | ICD-10-CM | POA: Insufficient documentation

## 2019-01-23 DIAGNOSIS — R1084 Generalized abdominal pain: Secondary | ICD-10-CM | POA: Diagnosis not present

## 2019-01-23 DIAGNOSIS — N1330 Unspecified hydronephrosis: Secondary | ICD-10-CM | POA: Diagnosis not present

## 2019-01-23 DIAGNOSIS — Z87442 Personal history of urinary calculi: Secondary | ICD-10-CM | POA: Diagnosis not present

## 2019-03-03 DIAGNOSIS — Z1159 Encounter for screening for other viral diseases: Secondary | ICD-10-CM | POA: Diagnosis not present

## 2019-04-25 DIAGNOSIS — Z23 Encounter for immunization: Secondary | ICD-10-CM | POA: Diagnosis not present

## 2019-05-13 DIAGNOSIS — F411 Generalized anxiety disorder: Secondary | ICD-10-CM | POA: Diagnosis not present

## 2019-05-19 DIAGNOSIS — Z1159 Encounter for screening for other viral diseases: Secondary | ICD-10-CM | POA: Diagnosis not present

## 2019-06-02 DIAGNOSIS — F411 Generalized anxiety disorder: Secondary | ICD-10-CM | POA: Diagnosis not present

## 2019-06-10 DIAGNOSIS — Z1159 Encounter for screening for other viral diseases: Secondary | ICD-10-CM | POA: Diagnosis not present

## 2019-07-08 DIAGNOSIS — F411 Generalized anxiety disorder: Secondary | ICD-10-CM | POA: Diagnosis not present

## 2019-08-27 DIAGNOSIS — F411 Generalized anxiety disorder: Secondary | ICD-10-CM | POA: Diagnosis not present

## 2019-10-06 DIAGNOSIS — F411 Generalized anxiety disorder: Secondary | ICD-10-CM | POA: Diagnosis not present

## 2019-10-09 DIAGNOSIS — Z0000 Encounter for general adult medical examination without abnormal findings: Secondary | ICD-10-CM | POA: Diagnosis not present

## 2019-10-09 DIAGNOSIS — Z13220 Encounter for screening for lipoid disorders: Secondary | ICD-10-CM | POA: Diagnosis not present

## 2019-10-23 DIAGNOSIS — Z13220 Encounter for screening for lipoid disorders: Secondary | ICD-10-CM | POA: Diagnosis not present

## 2019-10-23 DIAGNOSIS — Z0000 Encounter for general adult medical examination without abnormal findings: Secondary | ICD-10-CM | POA: Diagnosis not present

## 2019-10-23 DIAGNOSIS — R5383 Other fatigue: Secondary | ICD-10-CM | POA: Diagnosis not present

## 2019-10-28 DIAGNOSIS — Z1159 Encounter for screening for other viral diseases: Secondary | ICD-10-CM | POA: Diagnosis not present

## 2019-11-09 DIAGNOSIS — F411 Generalized anxiety disorder: Secondary | ICD-10-CM | POA: Diagnosis not present

## 2019-11-17 DIAGNOSIS — F411 Generalized anxiety disorder: Secondary | ICD-10-CM | POA: Diagnosis not present

## 2019-12-07 DIAGNOSIS — F411 Generalized anxiety disorder: Secondary | ICD-10-CM | POA: Diagnosis not present

## 2019-12-17 DIAGNOSIS — F411 Generalized anxiety disorder: Secondary | ICD-10-CM | POA: Diagnosis not present

## 2019-12-28 DIAGNOSIS — D2262 Melanocytic nevi of left upper limb, including shoulder: Secondary | ICD-10-CM | POA: Diagnosis not present

## 2019-12-28 DIAGNOSIS — L814 Other melanin hyperpigmentation: Secondary | ICD-10-CM | POA: Diagnosis not present

## 2019-12-28 DIAGNOSIS — Z3040 Encounter for surveillance of contraceptives, unspecified: Secondary | ICD-10-CM | POA: Diagnosis not present

## 2019-12-28 DIAGNOSIS — D225 Melanocytic nevi of trunk: Secondary | ICD-10-CM | POA: Diagnosis not present

## 2019-12-28 DIAGNOSIS — L905 Scar conditions and fibrosis of skin: Secondary | ICD-10-CM | POA: Diagnosis not present

## 2019-12-28 DIAGNOSIS — R87612 Low grade squamous intraepithelial lesion on cytologic smear of cervix (LGSIL): Secondary | ICD-10-CM | POA: Diagnosis not present

## 2019-12-28 DIAGNOSIS — Z01419 Encounter for gynecological examination (general) (routine) without abnormal findings: Secondary | ICD-10-CM | POA: Diagnosis not present

## 2020-01-07 DIAGNOSIS — F411 Generalized anxiety disorder: Secondary | ICD-10-CM | POA: Diagnosis not present

## 2020-01-25 DIAGNOSIS — F411 Generalized anxiety disorder: Secondary | ICD-10-CM | POA: Diagnosis not present

## 2020-02-10 DIAGNOSIS — F411 Generalized anxiety disorder: Secondary | ICD-10-CM | POA: Diagnosis not present

## 2020-03-03 DIAGNOSIS — F411 Generalized anxiety disorder: Secondary | ICD-10-CM | POA: Diagnosis not present

## 2020-04-05 DIAGNOSIS — F411 Generalized anxiety disorder: Secondary | ICD-10-CM | POA: Diagnosis not present

## 2020-04-27 DIAGNOSIS — F411 Generalized anxiety disorder: Secondary | ICD-10-CM | POA: Diagnosis not present

## 2020-05-16 DIAGNOSIS — F411 Generalized anxiety disorder: Secondary | ICD-10-CM | POA: Diagnosis not present

## 2020-06-06 ENCOUNTER — Other Ambulatory Visit: Payer: Self-pay | Admitting: Obstetrics and Gynecology

## 2020-06-06 DIAGNOSIS — R87612 Low grade squamous intraepithelial lesion on cytologic smear of cervix (LGSIL): Secondary | ICD-10-CM | POA: Diagnosis not present

## 2020-06-06 DIAGNOSIS — F411 Generalized anxiety disorder: Secondary | ICD-10-CM | POA: Diagnosis not present

## 2020-06-06 DIAGNOSIS — N870 Mild cervical dysplasia: Secondary | ICD-10-CM | POA: Diagnosis not present

## 2020-06-20 DIAGNOSIS — M25511 Pain in right shoulder: Secondary | ICD-10-CM | POA: Diagnosis not present

## 2020-06-23 DIAGNOSIS — Z08 Encounter for follow-up examination after completed treatment for malignant neoplasm: Secondary | ICD-10-CM | POA: Diagnosis not present

## 2020-06-23 DIAGNOSIS — N870 Mild cervical dysplasia: Secondary | ICD-10-CM | POA: Diagnosis not present

## 2020-06-29 DIAGNOSIS — Z1159 Encounter for screening for other viral diseases: Secondary | ICD-10-CM | POA: Diagnosis not present

## 2020-07-05 DIAGNOSIS — F411 Generalized anxiety disorder: Secondary | ICD-10-CM | POA: Diagnosis not present

## 2020-07-12 DIAGNOSIS — M25511 Pain in right shoulder: Secondary | ICD-10-CM | POA: Diagnosis not present

## 2020-07-20 DIAGNOSIS — F411 Generalized anxiety disorder: Secondary | ICD-10-CM | POA: Diagnosis not present

## 2020-07-21 DIAGNOSIS — M25511 Pain in right shoulder: Secondary | ICD-10-CM | POA: Insufficient documentation

## 2020-07-22 DIAGNOSIS — M7541 Impingement syndrome of right shoulder: Secondary | ICD-10-CM | POA: Diagnosis not present

## 2020-07-29 DIAGNOSIS — Z1159 Encounter for screening for other viral diseases: Secondary | ICD-10-CM | POA: Diagnosis not present

## 2020-08-10 DIAGNOSIS — F411 Generalized anxiety disorder: Secondary | ICD-10-CM | POA: Diagnosis not present

## 2020-08-29 DIAGNOSIS — Z7184 Encounter for health counseling related to travel: Secondary | ICD-10-CM | POA: Diagnosis not present

## 2020-08-29 DIAGNOSIS — R197 Diarrhea, unspecified: Secondary | ICD-10-CM | POA: Diagnosis not present

## 2020-08-29 DIAGNOSIS — F411 Generalized anxiety disorder: Secondary | ICD-10-CM | POA: Diagnosis not present

## 2020-09-05 DIAGNOSIS — N870 Mild cervical dysplasia: Secondary | ICD-10-CM | POA: Diagnosis not present

## 2020-09-07 DIAGNOSIS — M25532 Pain in left wrist: Secondary | ICD-10-CM | POA: Diagnosis not present

## 2020-09-28 DIAGNOSIS — F411 Generalized anxiety disorder: Secondary | ICD-10-CM | POA: Diagnosis not present

## 2020-10-21 DIAGNOSIS — Z1159 Encounter for screening for other viral diseases: Secondary | ICD-10-CM | POA: Diagnosis not present

## 2020-11-01 DIAGNOSIS — F411 Generalized anxiety disorder: Secondary | ICD-10-CM | POA: Diagnosis not present

## 2020-11-18 DIAGNOSIS — F411 Generalized anxiety disorder: Secondary | ICD-10-CM | POA: Diagnosis not present

## 2020-12-05 DIAGNOSIS — N870 Mild cervical dysplasia: Secondary | ICD-10-CM | POA: Diagnosis not present

## 2020-12-11 ENCOUNTER — Ambulatory Visit (HOSPITAL_COMMUNITY)
Admission: EM | Admit: 2020-12-11 | Discharge: 2020-12-11 | Disposition: A | Discharge status: Home / Self Care | Payer: BC Managed Care – PPO | Attending: Medical Oncology | Admitting: Medical Oncology

## 2020-12-11 ENCOUNTER — Other Ambulatory Visit: Payer: Self-pay

## 2020-12-11 ENCOUNTER — Encounter (HOSPITAL_COMMUNITY): Payer: Self-pay | Admitting: Emergency Medicine

## 2020-12-11 DIAGNOSIS — N939 Abnormal uterine and vaginal bleeding, unspecified: Secondary | ICD-10-CM | POA: Insufficient documentation

## 2020-12-11 LAB — CBC
HCT: 38.6 % (ref 36.0–46.0)
Hemoglobin: 12.7 g/dL (ref 12.0–15.0)
MCH: 32.2 pg (ref 26.0–34.0)
MCHC: 32.9 g/dL (ref 30.0–36.0)
MCV: 98 fL (ref 80.0–100.0)
Platelets: 263 10*3/uL (ref 150–400)
RBC: 3.94 MIL/uL (ref 3.87–5.11)
RDW: 11.4 % — ABNORMAL LOW (ref 11.5–15.5)
WBC: 6 10*3/uL (ref 4.0–10.5)
nRBC: 0 % (ref 0.0–0.2)

## 2020-12-11 MED ORDER — IBUPROFEN 800 MG PO TABS
ORAL_TABLET | ORAL | Status: AC
  Filled 2020-12-11: qty 1

## 2020-12-11 MED ORDER — IBUPROFEN 800 MG PO TABS
800.0000 mg | ORAL_TABLET | Freq: Once | ORAL | Status: AC
  Administered 2020-12-11: 800 mg via ORAL

## 2020-12-11 NOTE — Discharge Instructions (Addendum)
If your bleeding does not significantly reduce within 1 hour please go to the emergency room.  Please also stay hydrated with water and continue iron supplementation once daily.

## 2020-12-11 NOTE — ED Provider Notes (Addendum)
[facility]    CSN: [id number] Arrival date & time: 12/11/20  1610      History   Chief Complaint Chief Complaint  Patient presents with   Vaginal Bleeding    HPI Monique Casey is a 30 y.o. female.   HPI  Vaginal Bleeding: Patient presents today with her cousin.  Patient states that on Thursday she had cryotherapy performed of her cervix.  She states that she had some discharge thereafter and on Friday she began to have heavy vaginal bleeding.  She states that she is going through about 1 heavy pad per hour.  She reports that she felt lightheaded on Friday after working out hard at the gym but improved with hydration and rest.  She states that today she started to feel lightheaded and dizzy.  Her husband works at a [employer] station so she was seen there where she had her blood pressure taken which was 117/70 per patient.  She states that her heart rate was around 60.  They gave her 2 bags of fluids and she was urged to go to the emergency room or our office.  She ended up calling the on-call provider at her OB/GYN office who recommended that she take 800 mg of ibuprofen and return in the morning for blood work.  She states that she did not take the ibuprofen but presents today as she hopes to avoid the emergency room given financial concerns.  Currently she states that she feels better since receiving the fluids but is still tired and has a bit of a headache.  Past Medical History:  Diagnosis Date   Renal disorder     There are no problems to display for this patient.   Past Surgical History:  Procedure Laterality Date   WISDOM TOOTH EXTRACTION      OB History   No obstetric history on file.      Home Medications    Prior to Admission medications   Medication Sig Start Date End Date Taking? Authorizing Provider  Etonogestrel-Ethinyl Estradiol (NUVARING VA) Place 1 application vaginally.      [provider]  methylphenidate (CONCERTA) 18 MG CR  tablet Take 36 mg by mouth every morning. Takes two tablet every morning     [provider]  oxyCODONE-acetaminophen (PERCOCET) 5-325 MG per tablet Take 1 tablet by mouth every 4 (four) hours as needed. For pain    [provider]    Family History Family History  Problem Relation Age of Onset   Hyperlipidemia Mother    Hypertension Mother    Hyperlipidemia Father    Hypertension Father     Social History Social History   Tobacco Use   Smoking status: Never   Smokeless tobacco: Never  Substance Use Topics   Alcohol use: No   Drug use: No     Allergies   Daytrana [methylphenidate]   Review of Systems Review of Systems  As stated above in HPI Physical Exam Triage Vital Signs ED Triage Vitals  Enc Vitals Group     BP 12/11/20 1624 126/80     Pulse Rate 12/11/20 1624 70     Resp 12/11/20 1624 16     Temp 12/11/20 1624 98.2 F (36.8 C)     Temp Source 12/11/20 1624 Oral     SpO2 12/11/20 1624 98 %     Weight --      Height --      Head Circumference --  Peak Flow --      Pain Score 12/11/20 1622 0     Pain Loc --      Pain Edu? --      Excl. in GC? --    No data found.  Updated Vital Signs BP 126/80 (BP Location: Left Arm)   Pulse 70   Temp 98.2 F (36.8 C) (Oral)   Resp 16   LMP 11/16/2020   SpO2 98%   Physical Exam Vitals and nursing note reviewed.  Constitutional:      General: She is not in acute distress.    Appearance: Normal appearance. She is obese. She is not ill-appearing, toxic-appearing or diaphoretic.  HENT:     Head: Normocephalic and atraumatic.  Eyes:     Comments: Scant pallor of conjunctiva   Cardiovascular:     Rate and Rhythm: Normal rate and regular rhythm.     Pulses: Normal pulses.     Heart sounds: Normal heart sounds.  Pulmonary:     Breath sounds: Normal breath sounds.  Skin:    Capillary Refill: Capillary refill takes less than 2 seconds.     Coloration: Skin is not pale.  Neurological:      Mental Status: She is alert and oriented to person, place, and time.     UC Treatments / Results  Labs (all labs ordered are listed, but only abnormal results are displayed) Labs Reviewed  CBC    EKG   Radiology No results found.  Procedures Procedures (including critical care time)  Medications Ordered in UC Medications  ibuprofen (ADVIL) tablet 800 mg (800 mg Oral Given 12/11/20 1656)    Initial Impression / Assessment and Plan / UC Course  I have reviewed the triage vital signs and the nursing notes.  Pertinent labs & imaging results that were available during my care of the patient were reviewed by me and considered in my medical decision making (see chart for details).     New.  Personally called and discussed with the APP on-call for [facility].  They agreed with plan to have her trial 800 mg of ibuprofen along with hydration with water and iron supplementation as she wishes to avoid the ER at this time and given her reassuring vital signs.  Should her symptoms worsen she needs to go to the emergency room for evaluation and potential cervical cautery.  Also recommended follow-up with her OB/GYN first thing in the morning if her symptoms improve. She will have a chaperone and driver for the next 2 hours and should she feel worse at any point I have strongly urged her to go to the ER.  Final Clinical Impressions(s) / UC Diagnoses   Final diagnoses:  None   Discharge Instructions   None    ED Prescriptions   None    PDMP not reviewed this encounter.   Rushie Chestnut, PA-C 12/11/20 1708    Rushie Chestnut, PA-C 12/11/20 1718

## 2020-12-11 NOTE — ED Triage Notes (Signed)
Pt presents with vaginal bleeding post Cryotherapy xs 2 days. States has contacted on call GYN doctor and directed to urgent care.

## 2020-12-12 DIAGNOSIS — N939 Abnormal uterine and vaginal bleeding, unspecified: Secondary | ICD-10-CM | POA: Diagnosis not present

## 2020-12-14 DIAGNOSIS — F411 Generalized anxiety disorder: Secondary | ICD-10-CM | POA: Diagnosis not present

## 2021-01-03 DIAGNOSIS — F411 Generalized anxiety disorder: Secondary | ICD-10-CM | POA: Diagnosis not present

## 2021-01-19 DIAGNOSIS — N870 Mild cervical dysplasia: Secondary | ICD-10-CM | POA: Diagnosis not present

## 2021-02-10 DIAGNOSIS — D2261 Melanocytic nevi of right upper limb, including shoulder: Secondary | ICD-10-CM | POA: Diagnosis not present

## 2021-02-10 DIAGNOSIS — D225 Melanocytic nevi of trunk: Secondary | ICD-10-CM | POA: Diagnosis not present

## 2021-02-10 DIAGNOSIS — D2262 Melanocytic nevi of left upper limb, including shoulder: Secondary | ICD-10-CM | POA: Diagnosis not present

## 2021-02-10 DIAGNOSIS — L814 Other melanin hyperpigmentation: Secondary | ICD-10-CM | POA: Diagnosis not present

## 2021-03-08 DIAGNOSIS — U071 COVID-19: Secondary | ICD-10-CM | POA: Diagnosis not present

## 2021-03-20 DIAGNOSIS — R0602 Shortness of breath: Secondary | ICD-10-CM | POA: Diagnosis not present

## 2021-03-20 DIAGNOSIS — R5383 Other fatigue: Secondary | ICD-10-CM | POA: Diagnosis not present

## 2021-03-20 DIAGNOSIS — J40 Bronchitis, not specified as acute or chronic: Secondary | ICD-10-CM | POA: Diagnosis not present

## 2021-03-20 DIAGNOSIS — R059 Cough, unspecified: Secondary | ICD-10-CM | POA: Diagnosis not present

## 2021-03-22 DIAGNOSIS — F411 Generalized anxiety disorder: Secondary | ICD-10-CM | POA: Diagnosis not present

## 2021-04-07 DIAGNOSIS — F411 Generalized anxiety disorder: Secondary | ICD-10-CM | POA: Diagnosis not present

## 2021-05-05 DIAGNOSIS — F411 Generalized anxiety disorder: Secondary | ICD-10-CM | POA: Diagnosis not present

## 2021-06-08 DIAGNOSIS — F411 Generalized anxiety disorder: Secondary | ICD-10-CM | POA: Diagnosis not present

## 2021-06-29 DIAGNOSIS — F411 Generalized anxiety disorder: Secondary | ICD-10-CM | POA: Diagnosis not present

## 2021-07-18 DIAGNOSIS — F411 Generalized anxiety disorder: Secondary | ICD-10-CM | POA: Diagnosis not present

## 2021-08-01 DIAGNOSIS — F411 Generalized anxiety disorder: Secondary | ICD-10-CM | POA: Diagnosis not present

## 2021-08-21 DIAGNOSIS — Z01419 Encounter for gynecological examination (general) (routine) without abnormal findings: Secondary | ICD-10-CM | POA: Diagnosis not present

## 2021-08-21 DIAGNOSIS — Z3040 Encounter for surveillance of contraceptives, unspecified: Secondary | ICD-10-CM | POA: Diagnosis not present

## 2021-08-21 DIAGNOSIS — N870 Mild cervical dysplasia: Secondary | ICD-10-CM | POA: Diagnosis not present

## 2021-08-21 DIAGNOSIS — Z139 Encounter for screening, unspecified: Secondary | ICD-10-CM | POA: Diagnosis not present

## 2021-08-23 ENCOUNTER — Other Ambulatory Visit: Payer: Self-pay | Admitting: Obstetrics and Gynecology

## 2021-08-23 DIAGNOSIS — E049 Nontoxic goiter, unspecified: Secondary | ICD-10-CM

## 2021-08-23 DIAGNOSIS — F411 Generalized anxiety disorder: Secondary | ICD-10-CM | POA: Diagnosis not present

## 2021-09-04 ENCOUNTER — Ambulatory Visit
Admission: RE | Admit: 2021-09-04 | Discharge: 2021-09-04 | Disposition: A | Discharge status: Home / Self Care | Payer: BC Managed Care – PPO | Source: Ambulatory Visit | Attending: Obstetrics and Gynecology | Admitting: Obstetrics and Gynecology

## 2021-09-04 DIAGNOSIS — E041 Nontoxic single thyroid nodule: Secondary | ICD-10-CM | POA: Diagnosis not present

## 2021-09-04 DIAGNOSIS — E049 Nontoxic goiter, unspecified: Secondary | ICD-10-CM

## 2021-09-17 DIAGNOSIS — E041 Nontoxic single thyroid nodule: Secondary | ICD-10-CM | POA: Insufficient documentation

## 2021-09-28 DIAGNOSIS — M9903 Segmental and somatic dysfunction of lumbar region: Secondary | ICD-10-CM | POA: Diagnosis not present

## 2021-09-28 DIAGNOSIS — M5432 Sciatica, left side: Secondary | ICD-10-CM | POA: Diagnosis not present

## 2021-10-10 DIAGNOSIS — F411 Generalized anxiety disorder: Secondary | ICD-10-CM | POA: Diagnosis not present

## 2021-10-23 DIAGNOSIS — R079 Chest pain, unspecified: Secondary | ICD-10-CM | POA: Diagnosis not present

## 2021-11-02 DIAGNOSIS — M5137 Other intervertebral disc degeneration, lumbosacral region: Secondary | ICD-10-CM | POA: Diagnosis not present

## 2021-11-02 DIAGNOSIS — M545 Low back pain, unspecified: Secondary | ICD-10-CM | POA: Insufficient documentation

## 2021-11-02 DIAGNOSIS — M5451 Vertebrogenic low back pain: Secondary | ICD-10-CM | POA: Diagnosis not present

## 2021-11-29 DIAGNOSIS — F411 Generalized anxiety disorder: Secondary | ICD-10-CM | POA: Diagnosis not present

## 2021-12-04 DIAGNOSIS — R262 Difficulty in walking, not elsewhere classified: Secondary | ICD-10-CM | POA: Diagnosis not present

## 2021-12-06 DIAGNOSIS — R5382 Chronic fatigue, unspecified: Secondary | ICD-10-CM | POA: Diagnosis not present

## 2021-12-06 DIAGNOSIS — M2550 Pain in unspecified joint: Secondary | ICD-10-CM | POA: Diagnosis not present

## 2022-01-09 DIAGNOSIS — F411 Generalized anxiety disorder: Secondary | ICD-10-CM | POA: Diagnosis not present

## 2022-02-12 DIAGNOSIS — F411 Generalized anxiety disorder: Secondary | ICD-10-CM | POA: Diagnosis not present

## 2022-04-16 DIAGNOSIS — F411 Generalized anxiety disorder: Secondary | ICD-10-CM | POA: Diagnosis not present

## 2022-05-24 DIAGNOSIS — F411 Generalized anxiety disorder: Secondary | ICD-10-CM | POA: Diagnosis not present

## 2022-06-04 DIAGNOSIS — N939 Abnormal uterine and vaginal bleeding, unspecified: Secondary | ICD-10-CM | POA: Diagnosis not present

## 2022-06-04 DIAGNOSIS — Z113 Encounter for screening for infections with a predominantly sexual mode of transmission: Secondary | ICD-10-CM | POA: Diagnosis not present

## 2022-07-09 DIAGNOSIS — F411 Generalized anxiety disorder: Secondary | ICD-10-CM | POA: Diagnosis not present

## 2022-07-23 DIAGNOSIS — R311 Benign essential microscopic hematuria: Secondary | ICD-10-CM | POA: Diagnosis not present

## 2022-07-30 DIAGNOSIS — F411 Generalized anxiety disorder: Secondary | ICD-10-CM | POA: Diagnosis not present

## 2022-07-30 DIAGNOSIS — Z13220 Encounter for screening for lipoid disorders: Secondary | ICD-10-CM | POA: Diagnosis not present

## 2022-07-30 DIAGNOSIS — Z130 Encounter for screening for diseases of the blood and blood-forming organs and certain disorders involving the immune mechanism: Secondary | ICD-10-CM | POA: Diagnosis not present

## 2022-07-30 DIAGNOSIS — Z131 Encounter for screening for diabetes mellitus: Secondary | ICD-10-CM | POA: Diagnosis not present

## 2022-07-30 DIAGNOSIS — Z8349 Family history of other endocrine, nutritional and metabolic diseases: Secondary | ICD-10-CM | POA: Diagnosis not present

## 2022-07-30 DIAGNOSIS — R079 Chest pain, unspecified: Secondary | ICD-10-CM | POA: Diagnosis not present

## 2022-07-30 DIAGNOSIS — Z0000 Encounter for general adult medical examination without abnormal findings: Secondary | ICD-10-CM | POA: Diagnosis not present

## 2022-08-20 DIAGNOSIS — F411 Generalized anxiety disorder: Secondary | ICD-10-CM | POA: Diagnosis not present

## 2022-09-25 DIAGNOSIS — Z01419 Encounter for gynecological examination (general) (routine) without abnormal findings: Secondary | ICD-10-CM | POA: Diagnosis not present

## 2022-09-25 DIAGNOSIS — R7989 Other specified abnormal findings of blood chemistry: Secondary | ICD-10-CM | POA: Diagnosis not present

## 2022-09-25 DIAGNOSIS — Z3040 Encounter for surveillance of contraceptives, unspecified: Secondary | ICD-10-CM | POA: Diagnosis not present

## 2022-09-25 DIAGNOSIS — N870 Mild cervical dysplasia: Secondary | ICD-10-CM | POA: Diagnosis not present

## 2022-10-01 DIAGNOSIS — F411 Generalized anxiety disorder: Secondary | ICD-10-CM | POA: Diagnosis not present

## 2022-11-13 DIAGNOSIS — F411 Generalized anxiety disorder: Secondary | ICD-10-CM | POA: Diagnosis not present

## 2022-12-03 DIAGNOSIS — F411 Generalized anxiety disorder: Secondary | ICD-10-CM | POA: Diagnosis not present

## 2022-12-18 DIAGNOSIS — R5383 Other fatigue: Secondary | ICD-10-CM | POA: Diagnosis not present

## 2022-12-24 DIAGNOSIS — F411 Generalized anxiety disorder: Secondary | ICD-10-CM | POA: Diagnosis not present

## 2023-01-23 DIAGNOSIS — J069 Acute upper respiratory infection, unspecified: Secondary | ICD-10-CM | POA: Diagnosis not present

## 2023-01-23 DIAGNOSIS — Z03818 Encounter for observation for suspected exposure to other biological agents ruled out: Secondary | ICD-10-CM | POA: Diagnosis not present

## 2023-01-28 DIAGNOSIS — F411 Generalized anxiety disorder: Secondary | ICD-10-CM | POA: Diagnosis not present

## 2023-02-15 IMAGING — US US THYROID
1 series · 14 of 25 positions shown · non-contrast
Comparison: None.

CLINICAL DATA: Goiter

EXAM:
THYROID ULTRASOUND
TECHNIQUE: Ultrasound examination of the thyroid gland and adjacent soft
tissues was performed.

[Series 1: us thyroid · 0.04mm/px · 54 acquisitions, 14 frames shown]
[im 1/54]
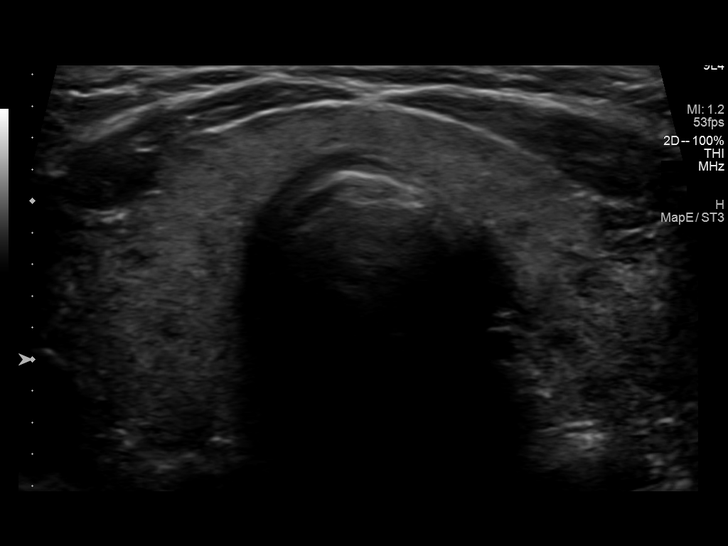
[im 5/54]
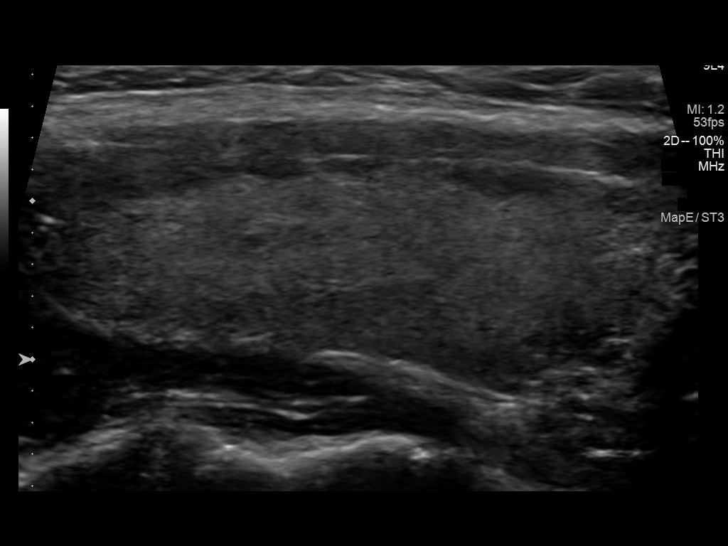
[im 9/54]
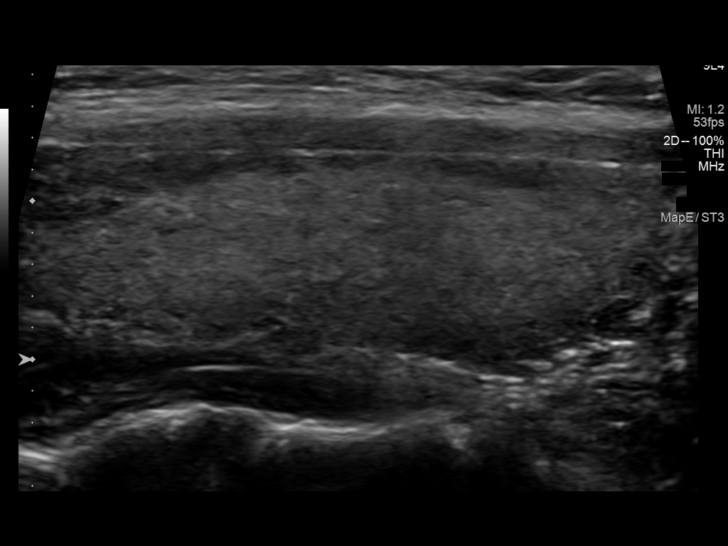
[im 14/54]
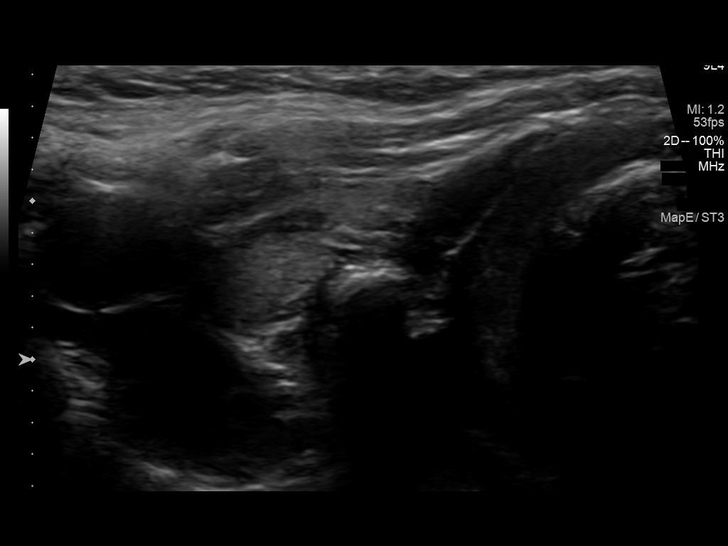
[im 18/54]
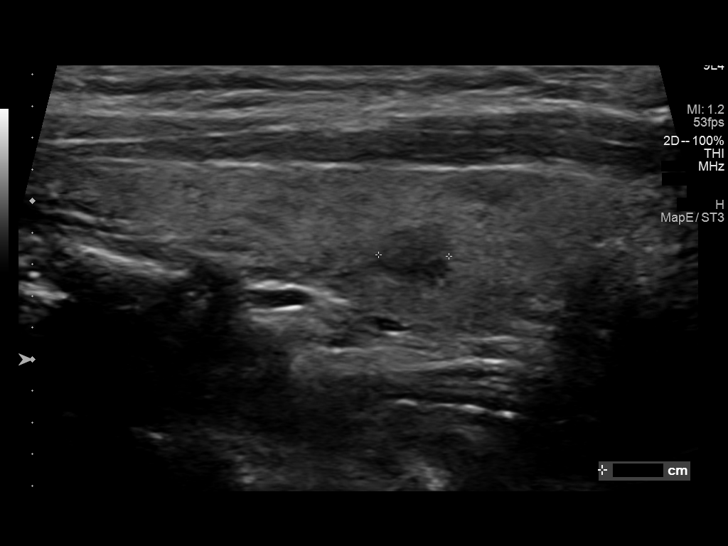
[im 20/54]
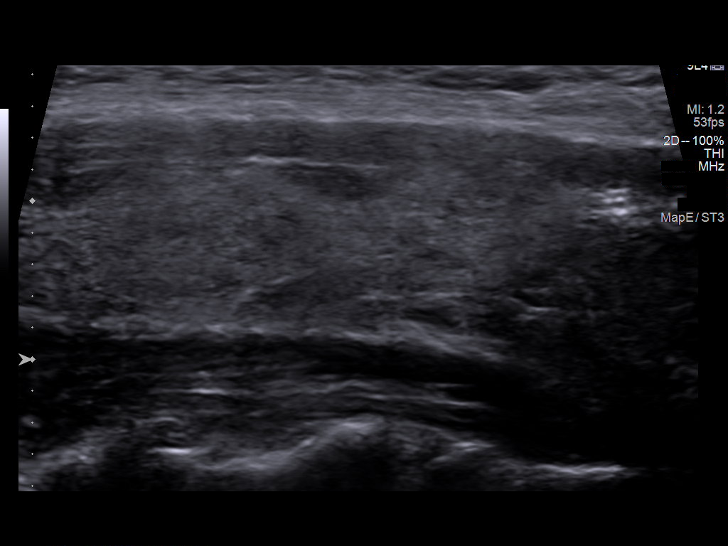
[im 25/54]
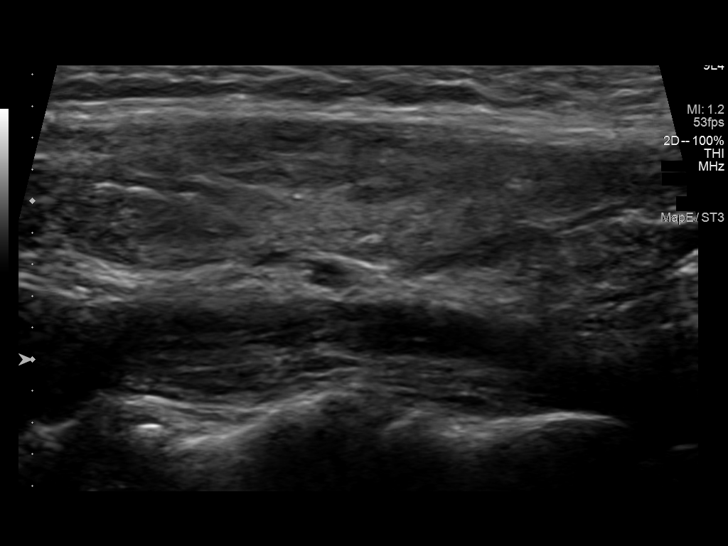
[im 29/54]
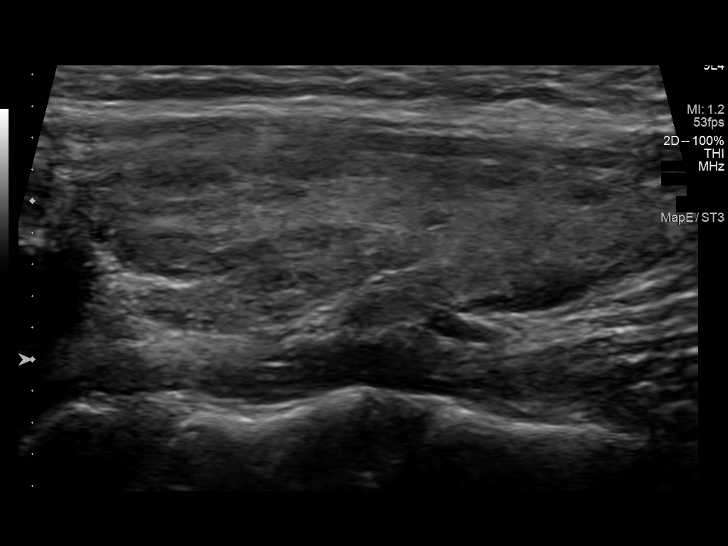
[im 34/54]
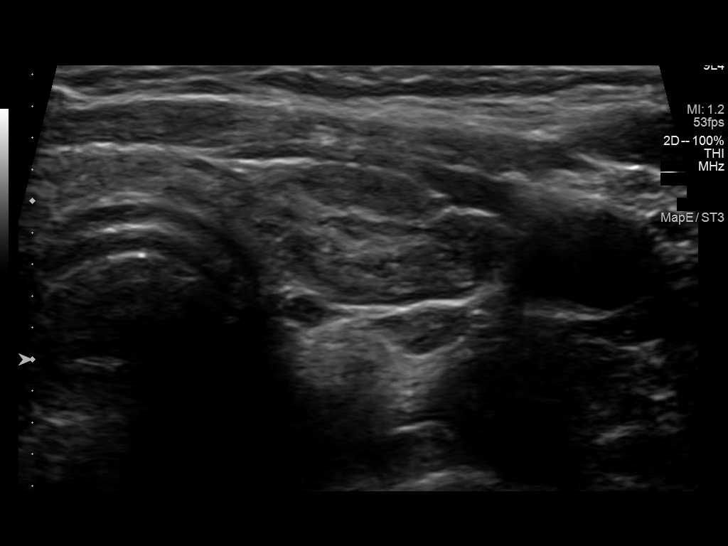
[im 36/54]
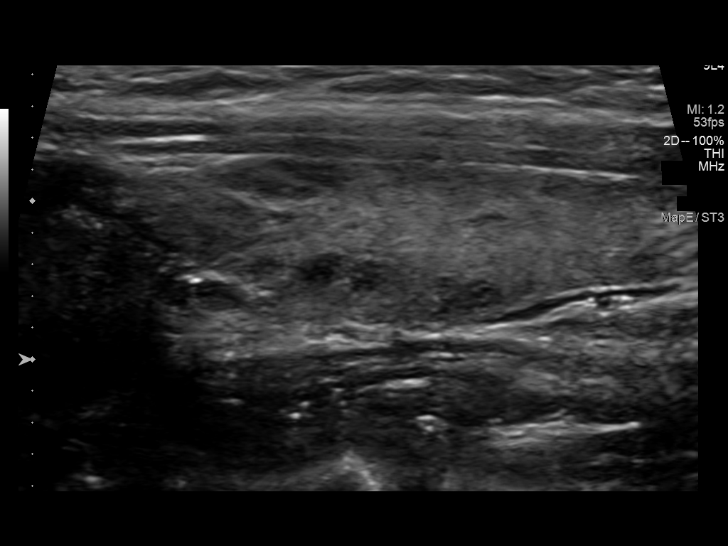
[im 40/54]
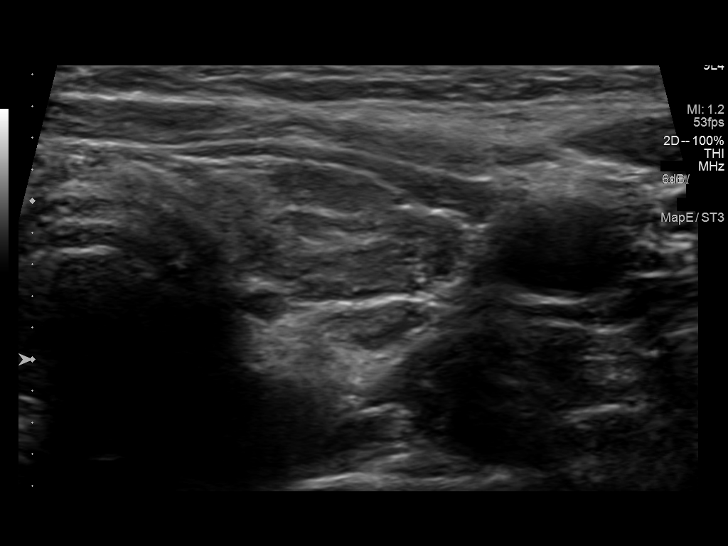
[im 45/54]
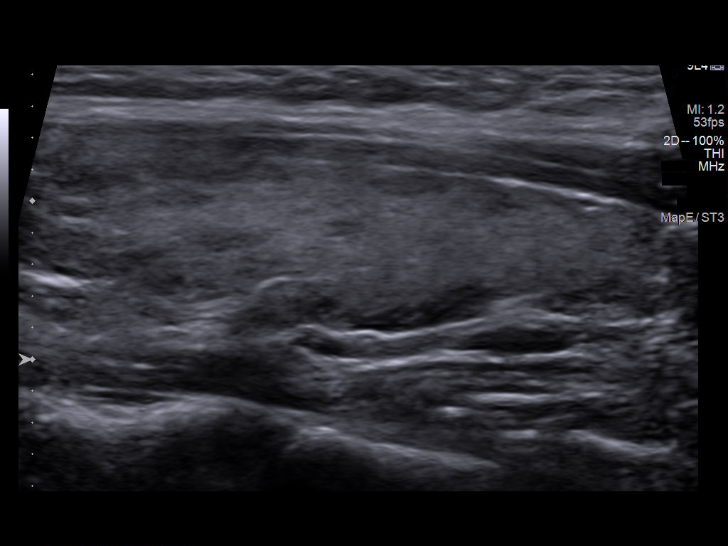
[im 49/54]
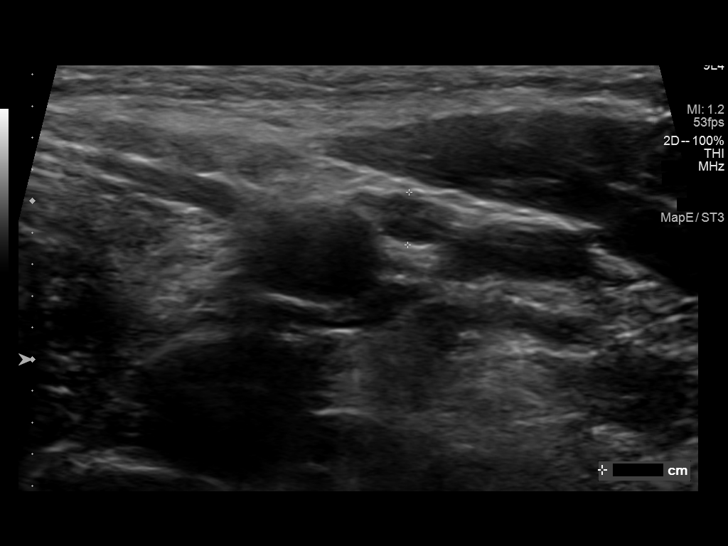
[im 54/54]
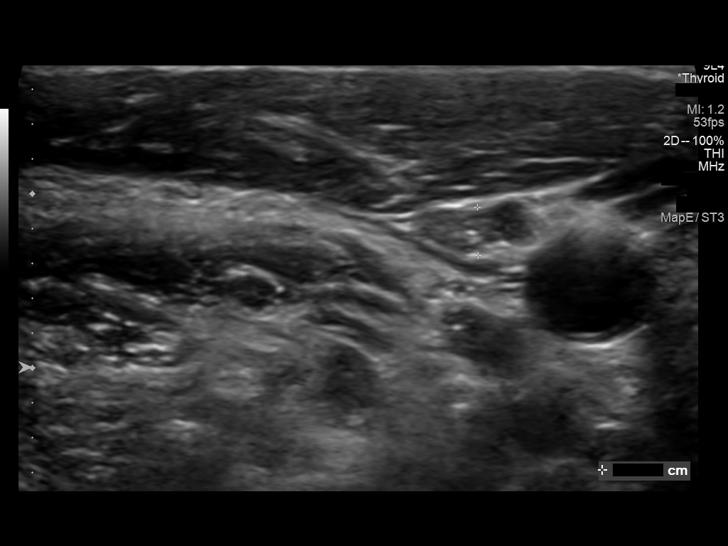

[14 of 25 positions shown; findings below may reference images not displayed]

FINDINGS: Parenchymal Echotexture: Moderately heterogeneous

Isthmus: 0.3 cm

Right lobe: 4.6 x 1.3 x 1.4 cm

Left lobe: 4.8 x 1.2 x 1.5 cm

_________________________________________________________

Estimated total number of nodules >/= 1 cm: 0

Number of spongiform nodules >/=  2 cm not described below (TR1): 0

Number of mixed cystic and solid nodules >/= 1.5 cm not described
below (TR2): 0

_________________________________________________________

5 mm nodule in the mid right thyroid lobe does not meet criteria for
FNA or imaging follow-up.

No enlarged neck lymph nodes.
IMPRESSION: 1. Moderate diffuse heterogeneity of the thyroid parenchyma.
2. 5 mm right thyroid nodule does not meet criteria for FNA or
imaging follow-up.

The above is in keeping with the ACR TI-RADS recommendations - [HOSPITAL] 2189;[DATE].

## 2023-03-04 DIAGNOSIS — F411 Generalized anxiety disorder: Secondary | ICD-10-CM | POA: Diagnosis not present

## 2023-03-11 DIAGNOSIS — M545 Low back pain, unspecified: Secondary | ICD-10-CM | POA: Insufficient documentation

## 2023-04-01 DIAGNOSIS — F411 Generalized anxiety disorder: Secondary | ICD-10-CM | POA: Diagnosis not present

## 2023-04-22 DIAGNOSIS — F411 Generalized anxiety disorder: Secondary | ICD-10-CM | POA: Diagnosis not present

## 2023-04-29 DIAGNOSIS — L720 Epidermal cyst: Secondary | ICD-10-CM | POA: Diagnosis not present

## 2023-04-29 DIAGNOSIS — D2271 Melanocytic nevi of right lower limb, including hip: Secondary | ICD-10-CM | POA: Diagnosis not present

## 2023-04-29 DIAGNOSIS — D2262 Melanocytic nevi of left upper limb, including shoulder: Secondary | ICD-10-CM | POA: Diagnosis not present

## 2023-04-29 DIAGNOSIS — L7211 Pilar cyst: Secondary | ICD-10-CM | POA: Diagnosis not present

## 2023-05-20 DIAGNOSIS — F411 Generalized anxiety disorder: Secondary | ICD-10-CM | POA: Diagnosis not present

## 2023-06-03 ENCOUNTER — Ambulatory Visit
Admission: EM | Admit: 2023-06-03 | Discharge: 2023-06-03 | Disposition: A | Discharge status: Home / Self Care | Payer: BC Managed Care – PPO | Attending: Family Medicine | Admitting: Family Medicine

## 2023-06-03 DIAGNOSIS — J22 Unspecified acute lower respiratory infection: Secondary | ICD-10-CM | POA: Diagnosis not present

## 2023-06-03 MED ORDER — AZITHROMYCIN 250 MG PO TABS: ORAL_TABLET | ORAL | 0 refills | Status: DC

## 2023-06-03 MED ORDER — PREDNISONE 20 MG PO TABS: ORAL_TABLET | ORAL | 0 refills | Status: DC

## 2023-06-03 MED ORDER — AMOXICILLIN-POT CLAVULANATE 875-125 MG PO TABS: 1.0000 | ORAL_TABLET | Freq: Two times a day (BID) | ORAL | 0 refills | Status: DC

## 2023-06-03 NOTE — ED Triage Notes (Signed)
Pt c/o cough, head/chest congestion fatigue x 3 weeks-states sx started while out of the country-states she completed doxy from [facility]-steady gait

## 2023-06-03 NOTE — Discharge Instructions (Addendum)
Please start Augmentin, azithromycin to address a lower respiratory infection. If you continue to have shortness of breath, wheezing, fatigue by Friday, then fill the script for prednisone.

## 2023-06-03 NOTE — ED Provider Notes (Signed)
[facility]  Note:  This document was prepared using Conservation officer, historic buildings and may include unintentional dictation errors.  MRN: [id number] DOB: [birth year]  Subjective:   Monique Casey is a 32 y.o. female presenting for 3-week history of acute onset persistent and worsening malaise, fatigue, chest congestion, coughing, shortness of breath.  Has also had persistent sinus congestion.  Symptoms started when she was traveling in [place].  She was seen there and started on doxycycline.  She had minimal improvement unfortunately.  No smoking of any kind including cigarettes, cigars, vaping, marijuana use.  No history of asthma.  No current facility-administered medications for this encounter.  Current Outpatient Medications:    cetirizine (ZYRTEC) 10 MG chewable tablet, , Disp: , Rfl:    Etonogestrel-Ethinyl Estradiol (NUVARING VA), Place 1 application vaginally.  , Disp: , Rfl:    Magnesium Gluconate (MAGNESIUM 27 PO), , Disp: , Rfl:    methylphenidate (CONCERTA) 18 MG CR tablet, Take 36 mg by mouth every morning. Takes two tablet every morning , Disp: , Rfl:    Omega-3 Fatty Acids (FISH OIL) 300 MG CAPS, , Disp: , Rfl:    oxyCODONE-acetaminophen (PERCOCET) 5-325 MG per tablet, Take 1 tablet by mouth every 4 (four) hours as needed. For pain, Disp: , Rfl:    Allergies  Allergen Reactions   Other     Adhesives/patches    Past Medical History:  Diagnosis Date   Renal disorder      Past Surgical History:  Procedure Laterality Date   KIDNEY STONE SURGERY     per pt   WISDOM TOOTH EXTRACTION      Family History  Problem Relation Age of Onset   Hyperlipidemia Mother    Hypertension Mother    Hyperlipidemia Father    Hypertension Father     Social History   Tobacco Use   Smoking status: Never   Smokeless tobacco: Never  Vaping Use   Vaping status: Never Used  Substance Use Topics   Alcohol use: Yes    Comment: occ   Drug use:  No    ROS   Objective:   Vitals: BP 115/77 (BP Location: Right Arm)   Pulse 78   Temp 98.3 F (36.8 C) (Oral)   Resp 16   LMP 03/21/2023   SpO2 99%   Physical Exam Constitutional:      General: She is not in acute distress.    Appearance: Normal appearance. She is well-developed. She is not ill-appearing, toxic-appearing or diaphoretic.  HENT:     Head: Normocephalic and atraumatic.     Nose: Nose normal.     Mouth/Throat:     Mouth: Mucous membranes are moist.  Eyes:     General: No scleral icterus.       Right eye: No discharge.        Left eye: No discharge.     Extraocular Movements: Extraocular movements intact.  Cardiovascular:     Rate and Rhythm: Normal rate and regular rhythm.     Heart sounds: Normal heart sounds. No murmur heard.    No friction rub. No gallop.  Pulmonary:     Effort: Pulmonary effort is normal. No respiratory distress.     Breath sounds: No stridor. No wheezing, rhonchi or rales.     Comments: Decreased lung sounds in mid to bibasilar fields. Chest:     Chest wall: No tenderness.  Skin:    General: Skin is warm and  dry.  Neurological:     General: No focal deficit present.     Mental Status: She is alert and oriented to person, place, and time.  Psychiatric:        Mood and Affect: Mood normal.        Behavior: Behavior normal.      Assessment and Plan :   PDMP not reviewed this encounter.  1. Lower respiratory infection    Deferred imaging.  Recommended coverage for lower respiratory infection with Augmentin and azithromycin.  Use supportive care otherwise.  I did provide patient with a prescription for prednisone in the event that she remains symptomatic over the next 3 to 4 days.  Thereafter recommend recheck in clinic if still no improvement.  Counseled patient on potential for adverse effects with medications prescribed/recommended today, ER and return-to-clinic precautions discussed, patient verbalized understanding.     Wallis Bamberg, [place] 06/04/23 [phone]

## 2023-06-13 ENCOUNTER — Emergency Department (HOSPITAL_BASED_OUTPATIENT_CLINIC_OR_DEPARTMENT_OTHER)
Admission: EM | Admit: 2023-06-13 | Discharge: 2023-06-13 | Disposition: A | Discharge status: Home / Self Care | Payer: BC Managed Care – PPO

## 2023-06-13 ENCOUNTER — Other Ambulatory Visit: Payer: Self-pay

## 2023-06-13 ENCOUNTER — Emergency Department (HOSPITAL_BASED_OUTPATIENT_CLINIC_OR_DEPARTMENT_OTHER): Payer: BC Managed Care – PPO | Admitting: Radiology

## 2023-06-13 DIAGNOSIS — R059 Cough, unspecified: Secondary | ICD-10-CM | POA: Insufficient documentation

## 2023-06-13 DIAGNOSIS — R0789 Other chest pain: Secondary | ICD-10-CM | POA: Diagnosis not present

## 2023-06-13 DIAGNOSIS — R079 Chest pain, unspecified: Secondary | ICD-10-CM | POA: Diagnosis not present

## 2023-06-13 DIAGNOSIS — R0602 Shortness of breath: Secondary | ICD-10-CM

## 2023-06-13 DIAGNOSIS — M545 Low back pain, unspecified: Secondary | ICD-10-CM | POA: Diagnosis not present

## 2023-06-13 LAB — BASIC METABOLIC PANEL
Anion gap: 9 (ref 5–15)
BUN: 19 mg/dL (ref 6–20)
CO2: 28 mmol/L (ref 22–32)
Calcium: 9.4 mg/dL (ref 8.9–10.3)
Chloride: 100 mmol/L (ref 98–111)
Creatinine, Ser: 0.73 mg/dL (ref 0.44–1.00)
GFR, Estimated: >60 mL/min (ref >60)
Glucose, Bld: 97 mg/dL (ref 70–99)
Potassium: 3.7 mmol/L (ref 3.5–5.1)
Sodium: 137 mmol/L (ref 135–145)

## 2023-06-13 LAB — CBC
HCT: 42.6 % (ref 36.0–46.0)
Hemoglobin: 15 g/dL (ref 12.0–15.0)
MCH: 31.6 pg (ref 26.0–34.0)
MCHC: 35.2 g/dL (ref 30.0–36.0)
MCV: 89.7 fL (ref 80.0–100.0)
Platelets: 371 10*3/uL (ref 150–400)
RBC: 4.75 MIL/uL (ref 3.87–5.11)
RDW: 11.6 % (ref 11.5–15.5)
WBC: 8.4 10*3/uL (ref 4.0–10.5)
nRBC: 0 % (ref 0.0–0.2)

## 2023-06-13 LAB — PREGNANCY, URINE: Preg Test, Ur: NEGATIVE

## 2023-06-13 LAB — D-DIMER, QUANTITATIVE: D-Dimer, Quant: <0.27 ug{FEU}/mL (ref 0.00–0.50)

## 2023-06-13 LAB — TROPONIN I (HIGH SENSITIVITY): Troponin I (High Sensitivity): <2 ng/L (ref <18)

## 2023-06-13 NOTE — ED Triage Notes (Signed)
Pt POV reporting persistent SOB, chest pain and back pain, dx pneumonia 2 weeks ago.

## 2023-06-13 NOTE — ED Provider Notes (Signed)
[place] EMERGENCY DEPARTMENT AT [facility] Provider Note   CSN: [id number] Arrival date & time: 06/13/23  1634     History  No chief complaint on file.   Monique GAVIRIA is a 32 y.o. female with no significant past medical history who presents with concern for persistent shortness of breath over the past 2 weeks.  States this initially started in [place], and she was seen by provider there who prescribed her antibiotics.  She took the whole course and was not feeling better so went to urgent care where she was diagnosed with pneumonia.  She was given further antibiotics, but states she has not had any improvement in symptoms.  Denies any fever or chills.  Reports associated dry cough and pain in her left lower back and general chest.  Denies any new lower extremity edema or calf pain.  HPI     Home Medications Prior to Admission medications   Medication Sig Start Date End Date Taking? Authorizing Provider  amoxicillin-clavulanate (AUGMENTIN) 875-125 MG tablet Take 1 tablet by mouth 2 (two) times daily. 06/03/23   Wallis Bamberg, PA-C  azithromycin (ZITHROMAX) 250 MG tablet Day 1: take 2 tablets. Day 2-5: Take 1 tablet daily. 06/03/23   Wallis Bamberg, PA-C  cetirizine (ZYRTEC) 10 MG chewable tablet     [provider]  Etonogestrel-Ethinyl Estradiol (NUVARING VA) Place 1 application vaginally.      [provider]  Magnesium Gluconate (MAGNESIUM 27 PO)     [provider]  methylphenidate (CONCERTA) 18 MG CR tablet Take 36 mg by mouth every morning. Takes two tablet every morning     [provider]  Omega-3 Fatty Acids (FISH OIL) 300 MG CAPS     [provider]  oxyCODONE-acetaminophen (PERCOCET) 5-325 MG per tablet Take 1 tablet by mouth every 4 (four) hours as needed. For pain    [provider]  predniSONE (DELTASONE) 20 MG tablet Take 2 tablets daily with breakfast. 06/03/23   Wallis Bamberg, PA-C       Allergies    Other    Review of Systems   Review of Systems  Constitutional:  Negative for fever.  Respiratory:  Positive for shortness of breath.     Physical Exam Updated Vital Signs BP 102/63 (BP Location: Left Arm)   Pulse 63   Temp 98.2 F (36.8 C) (Oral)   Resp 18   Ht 5\' 5"  (1.651 m)   Wt 65.8 kg   LMP 03/21/2023 (Exact Date)   SpO2 96%   BMI 24.13 kg/m  Physical Exam Vitals and nursing note reviewed.  Constitutional:      General: She is not in acute distress.    Appearance: She is well-developed.  HENT:     Head: Normocephalic and atraumatic.  Eyes:     Conjunctiva/sclera: Conjunctivae normal.  Cardiovascular:     Rate and Rhythm: Normal rate and regular rhythm.     Heart sounds: No murmur heard. Pulmonary:     Effort: Pulmonary effort is normal. No respiratory distress.     Breath sounds: Normal breath sounds.  Abdominal:     Palpations: Abdomen is soft.     Tenderness: There is no abdominal tenderness.     Comments: No CVA tenderness bilaterally  Musculoskeletal:        General: No swelling.     Cervical back: Neck supple.  Skin:    General: Skin is warm and dry.     Capillary Refill: Capillary  refill takes less than 2 seconds.  Neurological:     Mental Status: She is alert.  Psychiatric:        Mood and Affect: Mood normal.     ED Results / Procedures / Treatments   Labs (all labs ordered are listed, but only abnormal results are displayed) Labs Reviewed  BASIC METABOLIC PANEL  CBC  PREGNANCY, URINE  D-DIMER, QUANTITATIVE  TROPONIN I (HIGH SENSITIVITY)    EKG EKG Interpretation Date/Time:  Thursday June 13 2023 17:08:43 EST Ventricular Rate:  87 PR Interval:  128 QRS Duration:  74 QT Interval:  362 QTC Calculation: 435 R Axis:   62  Text Interpretation: Normal sinus rhythm with sinus arrhythmia Normal ECG No previous ECGs available Confirmed by Beckey Downing [phone]) on 06/13/2023 6:11:23 PM  Radiology DG Chest 2  View Result Date: 06/13/2023 CLINICAL DATA:  Chest pain and shortness of breath. Recent pneumonia EXAM: CHEST - 2 VIEW COMPARISON:  03/20/2021 FINDINGS: The heart size and mediastinal contours are within normal limits. Both lungs are clear. The visualized skeletal structures are unremarkable. IMPRESSION: Normal exam. Electronically Signed   By: Danae Orleans M.D.   On: 06/13/2023 17:34    Procedures Procedures    Medications Ordered in ED Medications - No data to display  ED Course/ Medical Decision Making/ A&P             HEART Score: 0                    Medical Decision Making Amount and/or Complexity of Data Reviewed Labs: ordered. Radiology: ordered.     Differential diagnosis includes but is not limited to ACS, arrhythmia, pericarditis, pleural effusion, DVT, PE, pneumonia, COVID, flu, RSV, viral URI, strep pharyngitis, viral pharyngitis, allergic rhinitis, pneumonia, bronchitis, musculoskeletal pain   ED Course:  Patient well-appearing, no acute distress.  Stable vital signs.  Her workup is very reassuring.  Troponin under 2, EKG with normal sinus rhythm, heart score of 0, low concern for ACS at this time.  Given concern for shortness of breath, and risk factors for DVT/PE such as recent plane travel and birth control use, obtain D-dimer.  This was undetectable.  No indication for further imaging for DVT or PE at this time.  Urine pregnancy negative.  CBC and BMP all within normal limits.  Chest x-ray normal.  No other respiratory symptoms, low concern for flu, COVID, RSV, or other viral URI at this time. Low concern for any other emergent etiology at this time. Patient stable and appropriate for discharge home at this time.   Impression: Shortness of breath Atypical chest pain  Disposition:  The patient was discharged home with instructions to follow-up with PCP in the next week if symptoms not improved. Return precautions given.   Imaging Studies ordered: I ordered  imaging studies including chest x-ray I independently visualized the imaging with scope of interpretation limited to determining acute life threatening conditions related to emergency care. Imaging showed no acute abnormalities I agree with the radiologist interpretation               Final Clinical Impression(s) / ED Diagnoses Final diagnoses:  Shortness of breath  Atypical chest pain    Rx / DC Orders ED Discharge Orders     None         Arabella Merles, Cordelia Poche 06/13/23 2149    Durwin Glaze, MD 06/13/23 2328

## 2023-06-13 NOTE — Discharge Instructions (Addendum)
Your workup today is reassuring. It is very unlikely that your pain is due to an issue in your heart. Your cardiac enzyme (troponin) was normal today. Your EKG which is a measure of the heart's rhythm and electrical activity is normal today.   Your chest x-ray is normal today.  We checked your blood counts, electrolytes, and kidney function today which are normal.  We checked a D-dimer which is a test to see if you may have a blood clot.  This was normal.   Return to the ER if you have any increasing shortness of breath, difficulty breathing, worsening chest pain, dizziness, jaw pain, left arm or shoulder pain, abdominal pain, fever, any other new or concerning symptoms

## 2023-06-24 DIAGNOSIS — F411 Generalized anxiety disorder: Secondary | ICD-10-CM | POA: Diagnosis not present

## 2023-07-03 DIAGNOSIS — F411 Generalized anxiety disorder: Secondary | ICD-10-CM | POA: Diagnosis not present

## 2023-07-15 DIAGNOSIS — F411 Generalized anxiety disorder: Secondary | ICD-10-CM | POA: Diagnosis not present

## 2023-08-05 DIAGNOSIS — L659 Nonscarring hair loss, unspecified: Secondary | ICD-10-CM | POA: Diagnosis not present

## 2023-08-05 DIAGNOSIS — E782 Mixed hyperlipidemia: Secondary | ICD-10-CM | POA: Diagnosis not present

## 2023-08-05 DIAGNOSIS — Z308 Encounter for other contraceptive management: Secondary | ICD-10-CM | POA: Diagnosis not present

## 2023-08-05 DIAGNOSIS — Z0000 Encounter for general adult medical examination without abnormal findings: Secondary | ICD-10-CM | POA: Diagnosis not present

## 2023-08-05 DIAGNOSIS — R1310 Dysphagia, unspecified: Secondary | ICD-10-CM | POA: Diagnosis not present

## 2023-08-12 DIAGNOSIS — Z13228 Encounter for screening for other metabolic disorders: Secondary | ICD-10-CM | POA: Diagnosis not present

## 2023-08-12 DIAGNOSIS — E782 Mixed hyperlipidemia: Secondary | ICD-10-CM | POA: Diagnosis not present

## 2023-08-12 DIAGNOSIS — L659 Nonscarring hair loss, unspecified: Secondary | ICD-10-CM | POA: Diagnosis not present

## 2023-08-28 ENCOUNTER — Other Ambulatory Visit (HOSPITAL_COMMUNITY): Payer: Self-pay | Admitting: Family Medicine

## 2023-08-28 DIAGNOSIS — R1310 Dysphagia, unspecified: Secondary | ICD-10-CM

## 2023-09-02 DIAGNOSIS — F411 Generalized anxiety disorder: Secondary | ICD-10-CM | POA: Diagnosis not present

## 2023-09-09 DIAGNOSIS — N926 Irregular menstruation, unspecified: Secondary | ICD-10-CM | POA: Diagnosis not present

## 2023-09-09 DIAGNOSIS — J302 Other seasonal allergic rhinitis: Secondary | ICD-10-CM | POA: Diagnosis not present

## 2023-09-23 ENCOUNTER — Ambulatory Visit (HOSPITAL_COMMUNITY)
Admission: RE | Admit: 2023-09-23 | Discharge: 2023-09-23 | Disposition: A | Discharge status: Home / Self Care | Source: Ambulatory Visit | Attending: *Deleted | Admitting: *Deleted

## 2023-09-23 DIAGNOSIS — R1314 Dysphagia, pharyngoesophageal phase: Secondary | ICD-10-CM | POA: Insufficient documentation

## 2023-09-23 DIAGNOSIS — F411 Generalized anxiety disorder: Secondary | ICD-10-CM | POA: Diagnosis not present

## 2023-09-23 DIAGNOSIS — R1310 Dysphagia, unspecified: Secondary | ICD-10-CM | POA: Diagnosis not present

## 2023-09-23 NOTE — Evaluation (Signed)
 Modified Barium Swallow Study  Patient Details  Name: Monique Casey MRN: [id number] Date of Birth: 09-09-1990  Today's Date: 09/23/2023  Modified Barium Swallow completed.  Full report located under Chart Review in the Imaging Section.  History of Present Illness 33 y.o. female referred for OP MBS by PCP. She endorses concern for food getting stuck in upper esophagus. She reports this has been going on for years. She notices it particularly with consumption of chicken or rice.  At times she will have to gag herself in order to vomit to get the food out.  Other times, she is able to get it down with large amounts of water. This occurs from one time/week to once per month.   Clinical Impression Pt presents with normal oropharyngeal swallowing but potential narrowing of proximal esophagus.  There was trace retention of thicker boluses below the PES.  13 mm barium pill transferred through LES. Oral phase was normal. There was reliable laryngeal vestibule closure with no aspiration.  Recommend further assessment of the esophagus, esophagram vs EGD or both if warranted. Will defer to physician. Discussed results/recs with pt and her husband, who was present. They watched the video in real time and all questions were answered. Factors that may increase risk of adverse event in presence of aspiration Rubye Oaks & Clearance Coots 2021):    Swallow Evaluation Recommendations Recommendations: PO diet PO Diet Recommendation: Regular;Thin liquids (Level 0) Liquid Administration via: Cup;Spoon;Straw Medication Administration: Whole meds with liquid (break into pieces if large) Supervision: Patient able to self-feed Oral care recommendations: Oral care BID (2x/day) Recommended consults: Consider GI consultation      Blenda Mounts Laurice 09/23/2023,1:31 PM Marchelle Folks L. Samson Frederic, MA CCC/SLP Clinical Specialist - Acute Care SLP Acute Rehabilitation Services Office number [phone]

## 2023-10-21 DIAGNOSIS — R799 Abnormal finding of blood chemistry, unspecified: Secondary | ICD-10-CM | POA: Diagnosis not present

## 2023-10-21 DIAGNOSIS — Z1329 Encounter for screening for other suspected endocrine disorder: Secondary | ICD-10-CM | POA: Diagnosis not present

## 2023-10-21 DIAGNOSIS — F411 Generalized anxiety disorder: Secondary | ICD-10-CM | POA: Diagnosis not present

## 2023-10-21 DIAGNOSIS — E049 Nontoxic goiter, unspecified: Secondary | ICD-10-CM | POA: Diagnosis not present

## 2023-10-21 DIAGNOSIS — Z01419 Encounter for gynecological examination (general) (routine) without abnormal findings: Secondary | ICD-10-CM | POA: Diagnosis not present

## 2023-10-21 DIAGNOSIS — N870 Mild cervical dysplasia: Secondary | ICD-10-CM | POA: Diagnosis not present

## 2023-10-21 LAB — RESULTS CONSOLE HPV: CHL HPV: NEGATIVE

## 2023-10-22 ENCOUNTER — Other Ambulatory Visit: Payer: Self-pay | Admitting: Obstetrics and Gynecology

## 2023-10-22 DIAGNOSIS — E049 Nontoxic goiter, unspecified: Secondary | ICD-10-CM

## 2023-10-24 LAB — HM PAP SMEAR: HM Pap smear: NEGATIVE

## 2023-11-13 ENCOUNTER — Ambulatory Visit
Admission: RE | Admit: 2023-11-13 | Discharge: 2023-11-13 | Disposition: A | Discharge status: Home / Self Care | Source: Ambulatory Visit | Attending: Obstetrics and Gynecology | Admitting: Obstetrics and Gynecology

## 2023-11-13 DIAGNOSIS — E049 Nontoxic goiter, unspecified: Secondary | ICD-10-CM | POA: Diagnosis not present

## 2023-11-25 DIAGNOSIS — F411 Generalized anxiety disorder: Secondary | ICD-10-CM | POA: Diagnosis not present

## 2023-12-18 ENCOUNTER — Ambulatory Visit: Admitting: Family Medicine

## 2023-12-18 ENCOUNTER — Encounter: Payer: Self-pay | Admitting: Family Medicine

## 2023-12-18 VITALS — BP 130/74 | HR 88 | Temp 98.0°F | Resp 16 | Ht 65.0 in | Wt 142.0 lb

## 2023-12-18 DIAGNOSIS — Z23 Encounter for immunization: Secondary | ICD-10-CM

## 2023-12-18 DIAGNOSIS — R1319 Other dysphagia: Secondary | ICD-10-CM

## 2023-12-18 DIAGNOSIS — E782 Mixed hyperlipidemia: Secondary | ICD-10-CM | POA: Insufficient documentation

## 2023-12-18 DIAGNOSIS — J302 Other seasonal allergic rhinitis: Secondary | ICD-10-CM | POA: Insufficient documentation

## 2023-12-18 DIAGNOSIS — R1310 Dysphagia, unspecified: Secondary | ICD-10-CM | POA: Insufficient documentation

## 2023-12-18 DIAGNOSIS — F901 Attention-deficit hyperactivity disorder, predominantly hyperactive type: Secondary | ICD-10-CM

## 2023-12-18 DIAGNOSIS — R7989 Other specified abnormal findings of blood chemistry: Secondary | ICD-10-CM | POA: Insufficient documentation

## 2023-12-18 DIAGNOSIS — G9332 Myalgic encephalomyelitis/chronic fatigue syndrome: Secondary | ICD-10-CM | POA: Insufficient documentation

## 2023-12-18 DIAGNOSIS — E063 Autoimmune thyroiditis: Secondary | ICD-10-CM

## 2023-12-18 MED ORDER — LISDEXAMFETAMINE DIMESYLATE 30 MG PO CAPS: 30.0000 mg | ORAL_CAPSULE | Freq: Every day | ORAL | 0 refills | Status: AC

## 2023-12-18 NOTE — Progress Notes (Signed)
 Chief Complaint  Patient presents with   Establish Care    Establishing Care       New Patient Visit SUBJECTIVE: HPI: Monique Casey is an 33 y.o.female who is being seen for establishing care.  The patient was [facility].  Patient has a history of Hashimoto's disease.  Her TPO was quite elevated.  T3, T4, and TSH are normal.  Mom has hypothyroidism, starting having symptoms requiring treatment in her 40s.  She does not have any symptoms other than some difficulty swallowing.  This led her to have an ultrasound which did not show thyromegaly.  She had a barium swallow study showing some narrowing in the proximal esophagus.  An EGD versus esophagram was recommended as the next step.  This is not set up yet.  She has had swallowing issues over the past couple years.  It seems to wax and wane but is not improving overall.  Patient has a history of ADHD, predominantly hyperactive.  She was on Concerta in the past but weaned off of it as it made her emotionally labile.  She is not on anything right now.  Most days she does fine but does have some days she wishes she had some sort of treatment.  She was diagnosed by the psychiatry team several years ago.  She has never been on another medication for this.  Past Medical History:  Diagnosis Date   Hashimoto's disease    Past Surgical History:  Procedure Laterality Date   KIDNEY STONE SURGERY  2017   per pt   WISDOM TOOTH EXTRACTION  2013   Family History  Problem Relation Age of Onset   Hyperlipidemia Mother    Hypertension Mother    Hypothyroidism Mother    Hyperlipidemia Father    Hypertension Father    Melanoma Maternal Grandfather    Breast cancer Paternal Grandmother    Gastric cancer Paternal Grandfather    Cervical cancer Maternal Aunt    Diabetes Neg Hx    Allergies  Allergen Reactions   Other     Adhesives/patches    Current Outpatient Medications:    lisdexamfetamine (VYVANSE) 30 MG capsule, Take 1  capsule (30 mg total) by mouth daily., Disp: 30 capsule, Rfl: 0   cetirizine (ZYRTEC) 10 MG chewable tablet, , Disp: , Rfl:    Omega-3 Fatty Acids (FISH OIL) 300 MG CAPS, , Disp: , Rfl:   OBJECTIVE: BP 130/74 (BP Location: Left Arm, Patient Position: Sitting)   Pulse 88   Temp 98 F (36.7 C) (Oral)   Resp 16   Ht 5' 5 (1.651 m)   Wt 142 lb (64.4 kg)   SpO2 96%   BMI 23.63 kg/m  General:  well developed, well nourished, in no apparent distress Lungs:  clear to auscultation, breath sounds equal bilaterally, no respiratory distress Cardio:  regular rate and rhythm, no LE edema or bruits Musculoskeletal:  symmetrical muscle groups noted without atrophy or deformity Neuro:  gait normal, DTRs equal and symmetric in the lower extremities.  No clonus. Psych: well oriented with normal range of affect and appropriate judgment/insight  ASSESSMENT/PLAN: Hashimoto's disease  Esophageal dysphagia - Plan: Ambulatory referral to Gastroenterology  ADHD (attention deficit hyperactivity disorder), predominantly hyperactive impulsive type - Plan: lisdexamfetamine (VYVANSE) 30 MG capsule  Need for Tdap vaccination - Plan: Tdap vaccine greater than or equal to 7yo IM  Will monitor thyroid  studies routinely now.  Currently normal. Nothing urgent/emergent but will set up with GI for  further evaluation/management. Chronic, not currently controlled.  Given side effects from Concerta, we will start her on Vyvanse 30 mg daily.  She will let me know if there are any issues. Tdap today. Patient should return in 7 mo for CPE or prn. The patient voiced understanding and agreement to the plan.   Mabel [place], DO 12/18/23  7:50 AM

## 2023-12-18 NOTE — Patient Instructions (Addendum)
 Foods that may reduce pain: 1) Ginger 2) Blueberries 3) Salmon 4) Pumpkin seeds 5) Dark chocolate 6) Turmeric 7) Tart cherries 8) Virgin olive oil 9) Chili peppers 10) Mint 11) Krill oil  Keep the diet clean and stay active.  Let me know if there are issues with the Vyvanse.   If you do not hear anything about your referral in the next 1-2 weeks, call our office and ask for an update.  Let us  know if you need anything.

## 2023-12-23 DIAGNOSIS — F411 Generalized anxiety disorder: Secondary | ICD-10-CM | POA: Diagnosis not present

## 2024-01-09 ENCOUNTER — Encounter: Payer: Self-pay | Admitting: Family Medicine

## 2024-01-09 ENCOUNTER — Telehealth: Payer: Self-pay

## 2024-01-09 NOTE — Telephone Encounter (Signed)
 Copied from CRM [id number]. Topic: Referral - Status >> Jan 09, 2024  9:22 AM Monique Casey wrote: Reason for CRM: A referral for GI was put in on 7/2 and patient has not heard back from them yet, wanting to know if someone can reach out. She has called twice and left a message and no one has contacted her

## 2024-01-20 DIAGNOSIS — F411 Generalized anxiety disorder: Secondary | ICD-10-CM | POA: Diagnosis not present

## 2024-02-03 ENCOUNTER — Encounter: Payer: Self-pay | Admitting: Gastroenterology

## 2024-02-10 DIAGNOSIS — F411 Generalized anxiety disorder: Secondary | ICD-10-CM | POA: Diagnosis not present

## 2024-03-30 ENCOUNTER — Encounter: Payer: Self-pay | Admitting: Gastroenterology

## 2024-03-30 ENCOUNTER — Ambulatory Visit: Admitting: Gastroenterology

## 2024-03-30 VITALS — BP 108/70 | HR 76 | Ht 65.0 in | Wt 150.4 lb

## 2024-03-30 DIAGNOSIS — R1110 Vomiting, unspecified: Secondary | ICD-10-CM | POA: Diagnosis not present

## 2024-03-30 DIAGNOSIS — R1310 Dysphagia, unspecified: Secondary | ICD-10-CM

## 2024-03-30 DIAGNOSIS — F411 Generalized anxiety disorder: Secondary | ICD-10-CM | POA: Diagnosis not present

## 2024-03-30 DIAGNOSIS — R1319 Other dysphagia: Secondary | ICD-10-CM

## 2024-03-30 NOTE — Patient Instructions (Signed)
 You have been scheduled for an endoscopy. Please follow written instructions given to you at your visit today.  If you use inhalers (even only as needed), please bring them with you on the day of your procedure.  If you take any of the following medications, they will need to be adjusted prior to your procedure:   DO NOT TAKE 7 DAYS PRIOR TO TEST- Trulicity (dulaglutide) Ozempic, Wegovy (semaglutide) Mounjaro (tirzepatide) Bydureon Bcise (exanatide extended release)  DO NOT TAKE 1 DAY PRIOR TO YOUR TEST Rybelsus (semaglutide) Adlyxin (lixisenatide) Victoza (liraglutide) Byetta (exanatide) ___________________________________________________________________________   _______________________________________________________  If your blood pressure at your visit was 140/90 or greater, please contact your primary care physician to follow up on this.  _______________________________________________________  If you are age 1 or older, your body mass index should be between 23-30. Your Body mass index is 25.02 kg/m. If this is out of the aforementioned range listed, please consider follow up with your Primary Care Provider.  If you are age 40 or younger, your body mass index should be between 19-25. Your Body mass index is 25.02 kg/m. If this is out of the aformentioned range listed, please consider follow up with your Primary Care Provider.   ________________________________________________________  The [facility] providers would like to encourage you to use MYCHART to communicate with providers for non-urgent requests or questions.  Due to long hold times on the telephone, sending your provider a message by [facility] may be a faster and more efficient way to get a response.  Please allow 48 business hours for a response.  Please remember that this is for non-urgent requests.  _______________________________________________________  [facility] is using a team-based approach  to care.  Your team is made up of your doctor and two to three APPS. Our APPS (Nurse Practitioners and Physician Assistants) work with your physician to ensure care continuity for you. They are fully qualified to address your health concerns and develop a treatment plan. They communicate directly with your gastroenterologist to care for you. Seeing the Advanced Practice Practitioners on your physician's team can help you by facilitating care more promptly, often allowing for earlier appointments, access to diagnostic testing, procedures, and other specialty referrals.    Thank you for trusting me with your gastrointestinal care!    Dr. Victory Legrand DOUGLAS [facility]

## 2024-03-30 NOTE — Progress Notes (Signed)
 [facility] Consult Note:  History: Monique Casey 03/30/2024  Referring provider: Frann Mabel Mt, DO  Reason for consult/chief complaint: No chief complaint on file.   Subjective  Prior history:  No prior GI history  Referred by primary care for dysphagia.  Diagnosed with autoimmune thyroiditis.  Thyroid  ultrasound performed due to esophageal dysphagia did not show thyromegaly.  Modified barium study with question of proximal esophageal narrowing (see below)   Discussed the use of AI scribe software for clinical note transcription with the patient, who gave verbal consent to proceed.  History of Present Illness Monique Casey is a 33 year old female who presents with swallowing difficulties.  She has experienced swallowing difficulties for over a year, particularly with dry foods such as chicken, rice, and chips. The sensation is described as food getting 'stuck' and sometimes leading to regurgitation. This issue occurs even when she is not eating quickly and has been consistent in frequency. It is particularly debilitating at work, as she sometimes has to excuse herself to the restroom due to choking.  No heartburn, stomach pain, or changes in bowel habits. However, she has had a few recent episodes of nausea and vomiting, which she attributes to food getting stuck. There is no history of blood in stools or mouth sores.  Her past medical workup includes an upper GI study and a modified barium swallow study with a speech pathologist, after which there was a question of possible narrowing in the upper esophagus according to the notes reviewed. She has a history of subclinical Hashimoto's, and an ultrasound of the thyroid  was normal.  Her family history includes GERD in her father, stomach cancer in her grandfather, and a strong family history of cirrhosis of the liver. She has a personal history of kidney stones and takes a half pill of  antihistamine daily for hives, which have been occurring for the past four to five months.   She is not been experiencing regurgitation or heartburn.  ROS:  Review of Systems  Constitutional:  Negative for appetite change and unexpected weight change.  HENT:  Negative for mouth sores and voice change.   Eyes:  Negative for pain and redness.  Respiratory:  Negative for cough and shortness of breath.   Cardiovascular:  Negative for chest pain and palpitations.  Genitourinary:  Negative for dysuria and hematuria.  Musculoskeletal:  Negative for arthralgias and myalgias.  Skin:  Negative for pallor and rash.  Neurological:  Negative for weakness and headaches.  Hematological:  Negative for adenopathy.     Past Medical History: Past Medical History:  Diagnosis Date   Hashimoto's disease    Kidney stones      Past Surgical History: Past Surgical History:  Procedure Laterality Date   DG BARIUM SWALLOW ([facility] HX)     KIDNEY STONE SURGERY  2017   per pt   WISDOM TOOTH EXTRACTION  2013     Family History: Family History  Problem Relation Age of Onset   Hyperlipidemia Mother    Hypertension Mother    Hypothyroidism Mother    Hyperlipidemia Father    Hypertension Father    Melanoma Maternal Grandfather    Breast cancer Paternal Grandmother    Gastric cancer Paternal Grandfather    Cervical cancer Maternal Aunt    Diabetes Neg Hx     Social History: Social History   Socioeconomic History   Marital status: Married    Spouse name: Not on file   Number of  children: 0   Years of education: Not on file   Highest education level: Not on file  Occupational History   Not on file  Tobacco Use   Smoking status: Never   Smokeless tobacco: Never  Vaping Use   Vaping status: Never Used  Substance and Sexual Activity   Alcohol use: Yes    Comment: occ   Drug use: No   Sexual activity: Yes    Partners: Male  Other Topics Concern   Not on file  Social History Narrative    Not on file   Social Drivers of Health   Financial Resource Strain: Not on file  Food Insecurity: Not on file  Transportation Needs: Not on file  Physical Activity: Not on file  Stress: Not on file  Social Connections: Not on file    Allergies: Allergies  Allergen Reactions   Other     Adhesives/patches    Outpatient Meds: Current Outpatient Medications  Medication Sig Dispense Refill   Ascorbic Acid (VITAMIN C) 1000 MG tablet Take 1,000 mg by mouth daily.     cetirizine (ZYRTEC) 10 MG chewable tablet      Cholecalciferol (VITAMIN D3) 25 MCG (1000 UT) CAPS Take 1 capsule by mouth daily.     lisdexamfetamine (VYVANSE ) 30 MG capsule Take 1 capsule (30 mg total) by mouth daily. 30 capsule 0   Magnesium 300 MG CAPS Take 300 mg by mouth as needed.     melatonin 3 MG TABS tablet Take 1 mg by mouth at bedtime.     Omega-3 Fatty Acids (FISH OIL) 300 MG CAPS      Turmeric 500 MG CAPS Take 500 mg by mouth daily.     Zinc 50 MG TABS Take 50 mg by mouth as needed.     No current facility-administered medications for this visit.      ___________________________________________________________________ Objective   Exam:  BP 108/70 (BP Location: Left Arm, Patient Position: Sitting, Cuff Size: Normal)   Pulse 76   Ht 5' 5 (1.651 m)   Wt 150 lb 6 oz (68.2 kg)   BMI 25.02 kg/m  Wt Readings from Last 3 Encounters:  03/30/24 150 lb 6 oz (68.2 kg)  12/18/23 142 lb (64.4 kg)  06/13/23 145 lb (65.8 kg)    General: Well-appearing, normal vocal quality Eyes: sclera anicteric, no redness ENT: oral mucosa moist without lesions, no cervical or supraclavicular lymphadenopathy CV: Regular without appreciable murmur, no JVD, no peripheral edema Resp: clear to auscultation bilaterally, normal RR and effort noted GI: soft, no tenderness, with active bowel sounds. No guarding or palpable organomegaly noted. Skin; warm and dry, no rash or jaundice noted Neuro: awake, alert and oriented x  3. Normal gross motor function and fluent speech   Labs:     Latest Ref Rng & Units 06/13/2023    5:03 PM 12/11/2020    4:54 PM 05/24/2011    7:35 PM  CBC  WBC 4.0 - 10.5 K/uL 8.4  6.0  13.2   Hemoglobin 12.0 - 15.0 g/dL 84.9  87.2  84.5   Hematocrit 36.0 - 46.0 % 42.6  38.6  42.8   Platelets 150 - 400 K/uL 371  263  318    No differential on the CBCs  Radiologic Studies: Modified barium study Clinical Impression: Pt presents with normal oropharyngeal swallowing but potential narrowing of proximal esophagus. There was trace retention of thicker boluses below the PES. 13 mm barium pill transferred through LES. Oral phase was  normal. There was reliable laryngeal vestibule closure with no aspiration. Recommend further assessment of the esophagus, esophagram vs EGD or both if warranted.  ________ Thyroid  ultrasound No discrete nodules are seen within the thyroid  gland.   IMPRESSION: Mildly heterogeneous but otherwise unremarkable thyroid  gland.   No thyroid  nodules.  Encounter Diagnosis  Name Primary?   Esophageal dysphagia Yes    Assessment & Plan Dysphagia Chronic dysphagia with solid food impaction and regurgitation. Modified barium swallow indicated possible upper esophageal narrowing.  Highest suspicion is for eosinophilic esophagitis, particularly without chronic reflux symptoms.  Differential also includes reflux related esophageal stricture or motility disorder.  EGD recommended and she was agreeable after thorough discussion of procedure and risks.  Thank you for the courtesy of this consult.  Please call me with any questions or concerns.  Victory LITTIE Brand III  CC: Referring provider noted above

## 2024-04-13 ENCOUNTER — Encounter: Payer: Self-pay | Admitting: Gastroenterology

## 2024-04-20 ENCOUNTER — Encounter: Payer: Self-pay | Admitting: Gastroenterology

## 2024-04-20 ENCOUNTER — Ambulatory Visit (AMBULATORY_SURGERY_CENTER): Admitting: Gastroenterology

## 2024-04-20 VITALS — BP 104/55 | HR 79 | Temp 97.7°F | Resp 14 | Ht 65.0 in | Wt 150.6 lb

## 2024-04-20 DIAGNOSIS — F411 Generalized anxiety disorder: Secondary | ICD-10-CM | POA: Diagnosis not present

## 2024-04-20 DIAGNOSIS — K200 Eosinophilic esophagitis: Secondary | ICD-10-CM | POA: Diagnosis not present

## 2024-04-20 DIAGNOSIS — R1310 Dysphagia, unspecified: Secondary | ICD-10-CM | POA: Diagnosis not present

## 2024-04-20 DIAGNOSIS — R1319 Other dysphagia: Secondary | ICD-10-CM

## 2024-04-20 DIAGNOSIS — K222 Esophageal obstruction: Secondary | ICD-10-CM

## 2024-04-20 MED ORDER — SODIUM CHLORIDE 0.9 % IV SOLN: 500.0000 mL | INTRAVENOUS | Status: DC

## 2024-04-20 NOTE — Progress Notes (Signed)
 Called to room to assist during endoscopic procedure.  Patient ID and intended procedure confirmed with present staff. Received instructions for my participation in the procedure from the performing physician.

## 2024-04-20 NOTE — Progress Notes (Signed)
 1043 Robinul 0.1 mg IV given due large amount of secretions upon assessment.  MD made aware, vss  Data will not transfer from monitor, vs being posted manually.

## 2024-04-20 NOTE — Op Note (Addendum)
 [facility] Patient Name: Monique Casey Procedure Date: 04/20/2024 10:35 AM MRN: [id number] Endoscopist: Victory L. Legrand , MD, [id number] Age: 33 Referring MD:  Date of Birth: Feb 14, 1991 Gender: Female Account #: [id number] Procedure:                Upper GI endoscopy Indications:              Esophageal dysphagia Medicines:                Monitored Anesthesia Care Procedure:                Pre-Anesthesia Assessment:                           - Prior to the procedure, a History and Physical                            was performed, and patient medications and                            allergies were reviewed. The patient's tolerance of                            previous anesthesia was also reviewed. The risks                            and benefits of the procedure and the sedation                            options and risks were discussed with the patient.                            All questions were answered, and informed consent                            was obtained. Prior Anticoagulants: The patient has                            taken no anticoagulant or antiplatelet agents. ASA                            Grade Assessment: II - A patient with mild systemic                            disease. After reviewing the risks and benefits,                            the patient was deemed in satisfactory condition to                            undergo the procedure.                           After obtaining informed consent, the endoscope was  passed under direct vision. Throughout the                            procedure, the patient's blood pressure, pulse, and                            oxygen saturations were monitored continuously. The                            Olympus Scope [id number] was introduced through the                            mouth, and advanced to the second part of duodenum.                            The upper GI endoscopy  was accomplished without                            difficulty. The patient tolerated the procedure                            well. Scope In: Scope Out: Findings:                 Mucosal changes including longitudinal furrows,                            congestion (edema), punctate white spots and                            stenosis were found in the middle third of the                            esophagus and in the lower third of the esophagus.                            Esophageal findings were graded using the                            Eosinophilic Esophagitis Endoscopic Reference Score                            (EoE-EREFS) as: Edema Grade 0 Normal (distinct                            vascular markings), Rings Grade 0 None (no ridges                            or rings seen), Exudates Grade 1 Mild (scattered                            white lesions involving less than 10 percent of the  esophageal surface area), Furrows Grade 1 Mild                            (vertical lines without visible depth) and                            Stricture present. Several biopsies were obtained                            in the middle third of the esophagus and in the                            lower third of the esophagus with cold forceps for                            evaluation of eosinophilic esophagitis (positive                            tug sign). EG junction stricture appeared to be                            approximately 12 mm in diameter. A guidewire was                            placed and the scope was withdrawn. Dilation was                            performed with a Savary dilator with no resistance                            at 12 mm, mild resistance at 14 mm and moderate                            resistance at 16 mm. Wire removed after the 14 mm                            dilator passage and the scope reinserted. This                             revealed no mucosal rent or diameter change                            throughout the esophagus. Wire was then replaced                            and the 16 mm dilation performed. Scope was                            reinserted and the dilation site was examined and                            showed moderate mucosal disruption and mild  improvement in luminal narrowing in the proximal                            esophagus only, with no change at the EG junction.                           The stomach was normal.                           The cardia and gastric fundus were normal on                            retroflexion.                           The examined duodenum was normal. Complications:            No immediate complications. Estimated Blood Loss:     Estimated blood loss was minimal. Impression:               - Esophageal mucosal changes consistent with                            eosinophilic esophagitis. Dilated.                           - Normal stomach.                           - Normal examined duodenum.                           - Several biopsies were obtained in the middle                            third of the esophagus and in the lower third of                            the esophagus. Recommendation:           - Patient has a contact number available for                            emergencies. The signs and symptoms of potential                            delayed complications were discussed with the                            patient. Return to normal activities tomorrow.                            Written discharge instructions were provided to the                            patient.                           -  Soft diet for the remainder of today, then resume                            previous diet.                           - Continue present medications.                           - Await pathology results.                           -  Clinic follow-up will be arranged after pathology                            results. Jalyn Dutta L. Legrand, MD 04/20/2024 11:33:22 AM This report has been signed electronically.

## 2024-04-20 NOTE — Patient Instructions (Signed)
 YOU HAD AN ENDOSCOPIC PROCEDURE TODAY AT THE [facility]:   Refer to the procedure report that was given to you for any specific questions about what was found during the examination.  If the procedure report does not answer your questions, please call your gastroenterologist to clarify.  If you requested that your care partner not be given the details of your procedure findings, then the procedure report has been included in a sealed envelope for you to review at your convenience later.  YOU SHOULD EXPECT: Some feelings of bloating in the abdomen. Passage of more gas than usual.  Walking can help get rid of the air that was put into your GI tract during the procedure and reduce the bloating. If you had a lower endoscopy (such as a colonoscopy or flexible sigmoidoscopy) you may notice spotting of blood in your stool or on the toilet paper. If you underwent a bowel prep for your procedure, you may not have a normal bowel movement for a few days.  Please Note:  You might notice some irritation and congestion in your nose or some drainage.  This is from the oxygen used during your procedure.  There is no need for concern and it should clear up in a day or so.  SYMPTOMS TO REPORT IMMEDIATELY:  Following upper endoscopy (EGD)  Vomiting of blood or coffee ground material  New chest pain or pain under the shoulder blades  Painful or persistently difficult swallowing  New shortness of breath  Fever of 100F or higher  Black, tarry-looking stools   Soft diet for the remainder of today, then resume previous diet Return to normal activities tomorrow Continue present medications Await pathology results Clinic follow up will be arranged after pathology results   For urgent or emergent issues, a gastroenterologist can be reached at any hour by calling [phone]. Do not use MyChart messaging for urgent concerns.    DIET:  We do recommend a small meal at first, but then you may proceed  to your regular diet.  Drink plenty of fluids but you should avoid alcoholic beverages for 24 hours.  ACTIVITY:  You should plan to take it easy for the rest of today and you should NOT DRIVE or use heavy machinery until tomorrow (because of the sedation medicines used during the test).    FOLLOW UP: Our staff will call the number listed on your records the next business day following your procedure.  We will call around 7:15- 8:00 am to check on you and address any questions or concerns that you may have regarding the information given to you following your procedure. If we do not reach you, we will leave a message.     If any biopsies were taken you will be contacted by phone or by letter within the next 1-3 weeks.  Please call us  at [phone] if you have not heard about the biopsies in 3 weeks.    SIGNATURES/CONFIDENTIALITY: You and/or your care partner have signed paperwork which will be entered into your electronic medical record.  These signatures attest to the fact that that the information above on your After Visit Summary has been reviewed and is understood.  Full responsibility of the confidentiality of this discharge information lies with you and/or your care-partner.

## 2024-04-20 NOTE — Progress Notes (Signed)
 No significant changes to clinical history since GI office visit on 03/30/24.  The patient is appropriate for an endoscopic procedure in the ambulatory setting.  - Victory Brand, MD

## 2024-04-20 NOTE — Progress Notes (Signed)
 Report given to PACU, vss

## 2024-04-21 ENCOUNTER — Telehealth: Payer: Self-pay | Admitting: *Deleted

## 2024-04-21 NOTE — Telephone Encounter (Signed)
  Follow up Call-     04/20/2024    9:58 AM  Call back number  Post procedure Call Back phone  # [phone]  Permission to leave phone message Yes     Patient questions:  Do you have a fever, pain , or abdominal swelling? No. Pain Score  0 *  Have you tolerated food without any problems? Pt states that eating has been difficult. Pt encouraged to follow soft diet for today and if she has any issues to contact office. Denies pain.   Have you been able to return to your normal activities? Yes.    Do you have any questions about your discharge instructions: Diet   No. Medications  No. Follow up visit  No.  Do you have questions or concerns about your Care? No.  Actions: * If pain score is 4 or above: No action needed, pain <4.

## 2024-04-22 LAB — SURGICAL PATHOLOGY

## 2024-04-23 ENCOUNTER — Encounter: Payer: Self-pay | Admitting: Gastroenterology

## 2024-04-24 NOTE — Telephone Encounter (Signed)
 As it stands now, I should be able to do a telemedicine next Monday, 11/10 at 4pm if procedures do not run over.  Cannot do it in the AM b/c procedures scheduled.  That time is really I would have open for a visit (telemed or otherwise) for the next several weeks.  If that works for her, please schedule that and we will call her if my availability changes.  Thanks  - HD

## 2024-04-27 ENCOUNTER — Ambulatory Visit: Payer: Self-pay | Admitting: Gastroenterology

## 2024-04-27 ENCOUNTER — Telehealth: Admitting: Gastroenterology

## 2024-04-27 ENCOUNTER — Encounter: Payer: Self-pay | Admitting: Gastroenterology

## 2024-04-27 DIAGNOSIS — K200 Eosinophilic esophagitis: Secondary | ICD-10-CM

## 2024-04-27 DIAGNOSIS — R1319 Other dysphagia: Secondary | ICD-10-CM | POA: Diagnosis not present

## 2024-04-27 MED ORDER — EOHILIA 2 MG/10ML PO SUSP: 10.0000 mL | Freq: Every evening | ORAL | 1 refills | Status: AC | PRN

## 2024-04-27 NOTE — Progress Notes (Signed)
 Telemedicine Visit:  Participants on the conference : myself and patient   The patient consented to this consultation and was aware that a charge will be placed through their insurance.  They were also made aware of the limitations of telemedicine.  I was in my office and the patient was at home.   Encounter time:  Total time 30 minutes, with 20 minutes spent with patient on [facility] (audio and video)   _____________________________________________________________________________________________            [facility] Progress Note  Chief Complaint: Dysphagia and eosinophilic esophagitis  Summary of GI history:          [facility] Consult Note:  History: DAVINITY FANARA 04/27/2024  Referring provider: Frann Mabel Mt, DO  Reason for consult/chief complaint: No chief complaint on file.   Subjective  HPI:  Yaminah followed up by telemedicine today for dysphagia after recent EGD and biopsies.  Dysphagia perhaps a little better after the endoscopic dilation.  She has had some sore throat afterwards which is slowly abating. She also did some reading about EOE after seeing the biopsy results. ROS:  She also reports some allergic issues of hives and GI distress with milk.  Scheduled for some allergy testing early next week.  The patient's Past Medical, Family and Social History were reviewed and are on file in the EMR. Past Medical History:  Diagnosis Date   Allergy    Hashimoto's disease    Kidney stones     Past Surgical History:  Procedure Laterality Date   DG BARIUM SWALLOW ([facility] HX)     KIDNEY STONE SURGERY  2017   per pt   WISDOM TOOTH EXTRACTION  2013    Objective:  Med list reviewed  Current Outpatient Medications:    Ascorbic Acid (VITAMIN C) 1000 MG tablet, Take 1,000 mg by mouth daily., Disp: , Rfl:    cetirizine (ZYRTEC) 10 MG chewable tablet, , Disp: , Rfl:    Cholecalciferol (VITAMIN D3) 25 MCG (1000 UT)  CAPS, Take 1 capsule by mouth daily., Disp: , Rfl:    lisdexamfetamine (VYVANSE ) 30 MG capsule, Take 1 capsule (30 mg total) by mouth daily. (Patient not taking: Reported on 04/20/2024), Disp: 30 capsule, Rfl: 0   Magnesium 300 MG CAPS, Take 300 mg by mouth as needed., Disp: , Rfl:    melatonin 3 MG TABS tablet, Take 1 mg by mouth at bedtime., Disp: , Rfl:    Omega-3 Fatty Acids (FISH OIL) 300 MG CAPS, , Disp: , Rfl:    Turmeric 500 MG CAPS, Take 500 mg by mouth daily., Disp: , Rfl:    Vitamin D-Vitamin K (K2 PLUS D3) [phone] MCG-UNIT TABS, , Disp: , Rfl:    Zinc 50 MG TABS, Take 50 mg by mouth as needed., Disp: , Rfl:    Vital signs in last 24 hrs: There were no vitals filed for this visit. Wt Readings from Last 3 Encounters:  04/20/24 150 lb 9.6 oz (68.3 kg)  03/30/24 150 lb 6 oz (68.2 kg)  12/18/23 142 lb (64.4 kg)    Physical Exam  No exam-telemedicine Appearing   ___________________________________________ Radiologic studies:   ____________________________________________ Other: FINAL DIAGNOSIS       1. Surgical [P], distal esophagus :      -  SQUAMOUS MUCOSA WITH BASAL CELL HYPERPLASIA, SPONGIOSIS, PAPILLARY ELONGATION      AND SIGNIFICANTLY INCREASED INTRAEPITHELIAL EOSINOPHILS (TOO NUMEROUS TO COUNT)      WITH UNDERLYING SUBMUCOSAL FIBROSIS CONSISTENT  WITH EOSINOPHILIC ESOPHAGITIS.       2. Surgical [P], mid esophagus :      -  SQUAMOUS MUCOSA WITH BASAL CELL HYPERPLASIA, SPONGIOSIS, PAPILLARY ELONGATION      AND SIGNIFICANTLY INCREASED INTRAEPITHELIAL EOSINOPHILS (TOO NUMEROUS TO COUNT)      WITH FOCAL UNDERLYING SUBMUCOSAL FIBROSIS CONSISTENT WITH EOSINOPHILIC      ESOPHAGITIS.  _________________  EGD report on file.  Patient had a copy of the report and have reviewed prior to this visit.  _____________________________________________ Assessment & Plan  Assessment: Encounter Diagnoses  Name Primary?   Esophageal dysphagia Yes   Eosinophilic esophagitis      Her diagnosis seems clear based on symptoms, endoscopic and biopsy findings.  She does not describe symptoms consistent with reflux, nor do the endoscopic biopsy findings suggest that.  Therefore, doubtful in my opinion that a trial of PPI would be helpful. I will send her a portal message with some further information about EOE and links to websites about it as well. I support her allergy testing for the other allergic symptoms she has been describing, though I made her aware that IgE allergy testing is not revealing for potential EOE triggers because the condition has not IgE-mediated.  She had already done some reading about this with illumination diets for EOE. We discussed it for a while, and I recommended she undergo a trial of swallowed budesonide with recently released Eohilia, 2 mg / 10 mL.  10 mL by mouth at bedtime, then gargle and spit some water, then nothing to eat or drink afterwards (ideally until the following morning if possible)  We will give her some samples to get started and I will send a prescription for it as well.  Repeat EGD in 6 to 8 weeks to assess endoscopic and histologic response to treatment. She is agreeable to that, we will check her work and personal schedule and then message us  to figure out the best time.  Made her aware possible oral thrush or Candida esophagitis (that would usually manifest as odynophagia).  Otherwise budesonide solution low risk especially for short-term use.  If does not respond to that, may be able to offer Dupixent if she is willing and insurance agrees.    Victory LITTIE Brand III

## 2024-04-29 DIAGNOSIS — N926 Irregular menstruation, unspecified: Secondary | ICD-10-CM | POA: Diagnosis not present

## 2024-04-29 DIAGNOSIS — N920 Excessive and frequent menstruation with regular cycle: Secondary | ICD-10-CM | POA: Diagnosis not present

## 2024-04-29 DIAGNOSIS — E282 Polycystic ovarian syndrome: Secondary | ICD-10-CM | POA: Diagnosis not present

## 2024-05-04 DIAGNOSIS — K200 Eosinophilic esophagitis: Secondary | ICD-10-CM | POA: Diagnosis not present

## 2024-05-04 DIAGNOSIS — H10403 Unspecified chronic conjunctivitis, bilateral: Secondary | ICD-10-CM | POA: Diagnosis not present

## 2024-05-04 DIAGNOSIS — J309 Allergic rhinitis, unspecified: Secondary | ICD-10-CM | POA: Diagnosis not present

## 2024-05-04 DIAGNOSIS — L501 Idiopathic urticaria: Secondary | ICD-10-CM | POA: Diagnosis not present

## 2024-05-25 DIAGNOSIS — R7689 Other specified abnormal immunological findings in serum: Secondary | ICD-10-CM | POA: Diagnosis not present

## 2024-05-25 DIAGNOSIS — N921 Excessive and frequent menstruation with irregular cycle: Secondary | ICD-10-CM | POA: Diagnosis not present

## 2024-06-23 ENCOUNTER — Encounter: Payer: Self-pay | Admitting: Gastroenterology

## 2024-06-24 NOTE — Telephone Encounter (Signed)
 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  But I will leave it to her discretion.  If she would like to do the EGD, it could be directly booked with me in the [facility]. If she would like to give it more time for observation of symptoms, that is also okay with me.  VEAR Brand MD

## 2024-06-25 NOTE — Telephone Encounter (Signed)
 Patient wished to schedule EGD. Has been scheduled for 1/21. Requesting a call to discuss prep instructions. Please advise, thank you

## 2024-06-26 ENCOUNTER — Telehealth: Payer: Self-pay | Admitting: *Deleted

## 2024-06-26 NOTE — Telephone Encounter (Signed)
 Attempted to reach patient to discuss EGD instructions.  LVM Sent [facility] message with EGD instructions and request to call or message us  if she has any additional questions.

## 2024-07-01 ENCOUNTER — Telehealth: Payer: Self-pay | Admitting: Gastroenterology

## 2024-07-01 NOTE — Telephone Encounter (Signed)
 Understood, thank you.  She can be rebooked directly for procedure in the [facility] when she contacts us .  VEAR Brand MD

## 2024-07-01 NOTE — Telephone Encounter (Signed)
 Good Morning Dr.Danis,  Patient calling needing to cancel upcoming EGD on 07/08/24 due to care partner, patient stated she will give us  a call back to reschedule procedure. Please advise  Thank you

## 2024-07-08 ENCOUNTER — Encounter: Admitting: Gastroenterology
